# Patient Record
Sex: Male | Born: 1935 | Race: White | Hispanic: No | Marital: Married | Smoking: Never smoker
Health system: Southern US, Community
[De-identification: ages and names within clinical notes are randomized; demographics above are authoritative.]

## PROBLEM LIST (undated history)

## (undated) DIAGNOSIS — I82409 Acute embolism and thrombosis of unspecified deep veins of unspecified lower extremity: Secondary | ICD-10-CM

## (undated) DIAGNOSIS — F0390 Unspecified dementia without behavioral disturbance: Secondary | ICD-10-CM

## (undated) DIAGNOSIS — I2699 Other pulmonary embolism without acute cor pulmonale: Secondary | ICD-10-CM

## (undated) HISTORY — PX: ROTATOR CUFF REPAIR: SHX139

## (undated) HISTORY — PX: SMALL INTESTINE SURGERY: SHX150

## (undated) HISTORY — PX: HERNIA REPAIR: SHX51

## (undated) HISTORY — PX: TRANSURETHRAL RESECTION OF PROSTATE: SHX73

---

## 1999-11-07 ENCOUNTER — Ambulatory Visit (HOSPITAL_COMMUNITY): Admission: RE | Admit: 1999-11-07 | Discharge: 1999-11-07 | Payer: Self-pay | Admitting: Internal Medicine

## 1999-11-07 ENCOUNTER — Encounter: Payer: Self-pay | Admitting: Internal Medicine

## 2000-09-24 ENCOUNTER — Encounter: Admission: RE | Admit: 2000-09-24 | Discharge: 2000-09-24 | Payer: Self-pay | Admitting: Internal Medicine

## 2000-09-24 ENCOUNTER — Encounter: Payer: Self-pay | Admitting: Internal Medicine

## 2003-08-27 ENCOUNTER — Ambulatory Visit (HOSPITAL_COMMUNITY): Admission: RE | Admit: 2003-08-27 | Discharge: 2003-08-27 | Payer: Self-pay | Admitting: *Deleted

## 2003-08-27 ENCOUNTER — Encounter (INDEPENDENT_AMBULATORY_CARE_PROVIDER_SITE_OTHER): Payer: Self-pay | Admitting: Specialist

## 2003-10-29 ENCOUNTER — Ambulatory Visit (HOSPITAL_COMMUNITY): Admission: RE | Admit: 2003-10-29 | Discharge: 2003-10-29 | Payer: Self-pay | Admitting: *Deleted

## 2003-10-29 ENCOUNTER — Encounter (INDEPENDENT_AMBULATORY_CARE_PROVIDER_SITE_OTHER): Payer: Self-pay | Admitting: *Deleted

## 2004-05-05 ENCOUNTER — Ambulatory Visit (HOSPITAL_COMMUNITY): Admission: RE | Admit: 2004-05-05 | Discharge: 2004-05-05 | Payer: Self-pay | Admitting: *Deleted

## 2004-05-05 ENCOUNTER — Encounter (INDEPENDENT_AMBULATORY_CARE_PROVIDER_SITE_OTHER): Payer: Self-pay | Admitting: Specialist

## 2004-06-06 ENCOUNTER — Encounter (INDEPENDENT_AMBULATORY_CARE_PROVIDER_SITE_OTHER): Payer: Self-pay | Admitting: *Deleted

## 2004-06-06 ENCOUNTER — Inpatient Hospital Stay (HOSPITAL_COMMUNITY): Admission: RE | Admit: 2004-06-06 | Discharge: 2004-06-11 | Payer: Self-pay | Admitting: General Surgery

## 2005-01-20 ENCOUNTER — Emergency Department (HOSPITAL_COMMUNITY): Admission: EM | Admit: 2005-01-20 | Discharge: 2005-01-20 | Payer: Self-pay | Admitting: Emergency Medicine

## 2005-06-05 ENCOUNTER — Encounter (INDEPENDENT_AMBULATORY_CARE_PROVIDER_SITE_OTHER): Payer: Self-pay | Admitting: *Deleted

## 2005-06-05 ENCOUNTER — Ambulatory Visit (HOSPITAL_COMMUNITY): Admission: RE | Admit: 2005-06-05 | Discharge: 2005-06-05 | Payer: Self-pay | Admitting: *Deleted

## 2006-02-09 ENCOUNTER — Encounter: Admission: RE | Admit: 2006-02-09 | Discharge: 2006-02-09 | Payer: Self-pay | Admitting: Internal Medicine

## 2006-02-26 ENCOUNTER — Emergency Department (HOSPITAL_COMMUNITY): Admission: EM | Admit: 2006-02-26 | Discharge: 2006-02-26 | Payer: Self-pay | Admitting: Family Medicine

## 2006-07-13 ENCOUNTER — Ambulatory Visit (HOSPITAL_COMMUNITY): Admission: RE | Admit: 2006-07-13 | Discharge: 2006-07-13 | Payer: Self-pay | Admitting: Chiropractic Medicine

## 2006-09-01 ENCOUNTER — Emergency Department (HOSPITAL_COMMUNITY): Admission: EM | Admit: 2006-09-01 | Discharge: 2006-09-01 | Payer: Self-pay | Admitting: Family Medicine

## 2007-05-29 ENCOUNTER — Ambulatory Visit (HOSPITAL_COMMUNITY): Admission: RE | Admit: 2007-05-29 | Discharge: 2007-05-30 | Payer: Self-pay | Admitting: Urology

## 2007-06-04 ENCOUNTER — Encounter: Admission: RE | Admit: 2007-06-04 | Discharge: 2007-06-04 | Payer: Self-pay | Admitting: Internal Medicine

## 2007-07-23 ENCOUNTER — Encounter (INDEPENDENT_AMBULATORY_CARE_PROVIDER_SITE_OTHER): Payer: Self-pay | Admitting: *Deleted

## 2007-07-23 ENCOUNTER — Ambulatory Visit (HOSPITAL_COMMUNITY): Admission: RE | Admit: 2007-07-23 | Discharge: 2007-07-23 | Payer: Self-pay | Admitting: *Deleted

## 2007-09-05 ENCOUNTER — Encounter: Admission: RE | Admit: 2007-09-05 | Discharge: 2007-09-05 | Payer: Self-pay | Admitting: Neurosurgery

## 2008-06-02 ENCOUNTER — Encounter: Admission: RE | Admit: 2008-06-02 | Discharge: 2008-06-02 | Payer: Self-pay | Admitting: Internal Medicine

## 2009-04-16 ENCOUNTER — Encounter: Admission: RE | Admit: 2009-04-16 | Discharge: 2009-04-16 | Payer: Self-pay | Admitting: Internal Medicine

## 2010-04-24 ENCOUNTER — Encounter: Payer: Self-pay | Admitting: Neurosurgery

## 2010-06-01 ENCOUNTER — Other Ambulatory Visit: Payer: Self-pay | Admitting: Internal Medicine

## 2010-06-01 ENCOUNTER — Inpatient Hospital Stay (HOSPITAL_COMMUNITY)
Admission: EM | Admit: 2010-06-01 | Discharge: 2010-06-05 | DRG: 301 | Disposition: A | Discharge status: Home Health | Payer: Medicare Other | Attending: Internal Medicine | Admitting: Internal Medicine

## 2010-06-01 ENCOUNTER — Ambulatory Visit (HOSPITAL_COMMUNITY)
Admission: RE | Admit: 2010-06-01 | Discharge: 2010-06-01 | Disposition: A | Discharge status: Home / Self Care | Payer: Medicare Other | Source: Ambulatory Visit | Attending: Internal Medicine | Admitting: Internal Medicine

## 2010-06-01 DIAGNOSIS — I82409 Acute embolism and thrombosis of unspecified deep veins of unspecified lower extremity: Principal | ICD-10-CM | POA: Diagnosis present

## 2010-06-01 DIAGNOSIS — Z860100 Personal history of colon polyps, unspecified: Secondary | ICD-10-CM

## 2010-06-01 DIAGNOSIS — R9389 Abnormal findings on diagnostic imaging of other specified body structures: Secondary | ICD-10-CM | POA: Insufficient documentation

## 2010-06-01 DIAGNOSIS — M79604 Pain in right leg: Secondary | ICD-10-CM

## 2010-06-01 DIAGNOSIS — D320 Benign neoplasm of cerebral meninges: Secondary | ICD-10-CM | POA: Diagnosis present

## 2010-06-01 DIAGNOSIS — F0390 Unspecified dementia without behavioral disturbance: Secondary | ICD-10-CM | POA: Diagnosis present

## 2010-06-01 DIAGNOSIS — M79609 Pain in unspecified limb: Secondary | ICD-10-CM | POA: Insufficient documentation

## 2010-06-01 DIAGNOSIS — D696 Thrombocytopenia, unspecified: Secondary | ICD-10-CM | POA: Diagnosis present

## 2010-06-01 DIAGNOSIS — N400 Enlarged prostate without lower urinary tract symptoms: Secondary | ICD-10-CM | POA: Diagnosis present

## 2010-06-01 DIAGNOSIS — M4802 Spinal stenosis, cervical region: Secondary | ICD-10-CM | POA: Diagnosis present

## 2010-06-01 DIAGNOSIS — M7989 Other specified soft tissue disorders: Secondary | ICD-10-CM | POA: Insufficient documentation

## 2010-06-01 LAB — BASIC METABOLIC PANEL
CO2: 28 mEq/L (ref 19–32)
Calcium: 9.4 mg/dL (ref 8.4–10.5)
GFR calc non Af Amer: >60 mL/min (ref >60)
Sodium: 138 mEq/L (ref 135–145)

## 2010-06-01 LAB — PROTIME-INR: Prothrombin Time: 14 seconds (ref 11.6–15.2)

## 2010-06-01 LAB — DIFFERENTIAL
Basophils Relative: 0 % (ref 0–1)
Eosinophils Absolute: 0.2 10*3/uL (ref 0.0–0.7)
Eosinophils Relative: 2 % (ref 0–5)
Lymphocytes Relative: 14 % (ref 12–46)
Lymphs Abs: 1.3 10*3/uL (ref 0.7–4.0)
Monocytes Relative: 16 % — ABNORMAL HIGH (ref 3–12)
Neutro Abs: 6.4 10*3/uL (ref 1.7–7.7)

## 2010-06-01 LAB — CBC
MCH: 29.6 pg (ref 26.0–34.0)
Platelets: 107 10*3/uL — ABNORMAL LOW (ref 150–400)
RBC: 5.44 MIL/uL (ref 4.22–5.81)
RDW: 12.8 % (ref 11.5–15.5)

## 2010-06-01 LAB — APTT: aPTT: 30 seconds (ref 24–37)

## 2010-06-02 ENCOUNTER — Inpatient Hospital Stay (HOSPITAL_COMMUNITY): Payer: Medicare Other

## 2010-06-02 LAB — COMPREHENSIVE METABOLIC PANEL
AST: 14 U/L (ref 0–37)
Albumin: 3.3 g/dL — ABNORMAL LOW (ref 3.5–5.2)
BUN: 10 mg/dL (ref 6–23)
Calcium: 8.8 mg/dL (ref 8.4–10.5)
Creatinine, Ser: 0.75 mg/dL (ref 0.4–1.5)
Total Protein: 5.7 g/dL — ABNORMAL LOW (ref 6.0–8.3)

## 2010-06-02 LAB — PROTIME-INR: Prothrombin Time: 14.3 seconds (ref 11.6–15.2)

## 2010-06-02 LAB — CBC
MCH: 29.8 pg (ref 26.0–34.0)
MCHC: 33.4 g/dL (ref 30.0–36.0)
MCV: 89.3 fL (ref 78.0–100.0)
Platelets: 107 10*3/uL — ABNORMAL LOW (ref 150–400)
RBC: 4.86 MIL/uL (ref 4.22–5.81)
RDW: 12.8 % (ref 11.5–15.5)

## 2010-06-02 LAB — HEPARIN LEVEL (UNFRACTIONATED)

## 2010-06-02 LAB — APTT

## 2010-06-02 LAB — PHOSPHORUS: Phosphorus: 2.4 mg/dL (ref 2.3–4.6)

## 2010-06-02 LAB — TECHNOLOGIST SMEAR REVIEW

## 2010-06-02 LAB — MAGNESIUM: Magnesium: 2.1 mg/dL (ref 1.5–2.5)

## 2010-06-03 LAB — CBC
HCT: 42.3 % (ref 39.0–52.0)
MCH: 29.6 pg (ref 26.0–34.0)
MCV: 89.4 fL (ref 78.0–100.0)
Platelets: 120 10*3/uL — ABNORMAL LOW (ref 150–400)
RBC: 4.73 MIL/uL (ref 4.22–5.81)
RDW: 12.8 % (ref 11.5–15.5)
WBC: 7.7 10*3/uL (ref 4.0–10.5)

## 2010-06-03 LAB — PSA: PSA: 0.11 ng/mL (ref <=4.00)

## 2010-06-03 MED ORDER — GADOBENATE DIMEGLUMINE 529 MG/ML IV SOLN
15.0000 mL | Freq: Once | INTRAVENOUS | Status: AC | PRN
  Administered 2010-06-03: 15 mL via INTRAVENOUS

## 2010-06-04 LAB — CBC
HCT: 42.9 % (ref 39.0–52.0)
MCHC: 32.9 g/dL (ref 30.0–36.0)
MCV: 88.8 fL (ref 78.0–100.0)
RDW: 12.7 % (ref 11.5–15.5)

## 2010-06-04 LAB — BASIC METABOLIC PANEL
BUN: 12 mg/dL (ref 6–23)
CO2: 30 mEq/L (ref 19–32)
Chloride: 103 mEq/L (ref 96–112)
Creatinine, Ser: 0.93 mg/dL (ref 0.4–1.5)

## 2010-06-05 LAB — BASIC METABOLIC PANEL WITH GFR
BUN: 8 mg/dL (ref 6–23)
CO2: 32 meq/L (ref 19–32)
Calcium: 8.9 mg/dL (ref 8.4–10.5)
Chloride: 103 meq/L (ref 96–112)
Creatinine, Ser: 0.87 mg/dL (ref 0.4–1.5)
GFR calc non Af Amer: >60 mL/min
Glucose, Bld: 111 mg/dL — ABNORMAL HIGH (ref 70–99)
Potassium: 4.4 meq/L (ref 3.5–5.1)
Sodium: 140 meq/L (ref 135–145)

## 2010-06-05 LAB — CBC
HCT: 43.9 % (ref 39.0–52.0)
Hemoglobin: 14.6 g/dL (ref 13.0–17.0)
MCH: 29.4 pg (ref 26.0–34.0)
MCHC: 33.3 g/dL (ref 30.0–36.0)
MCV: 88.3 fL (ref 78.0–100.0)
Platelets: 144 10*3/uL — ABNORMAL LOW (ref 150–400)
RBC: 4.97 MIL/uL (ref 4.22–5.81)
RDW: 12.5 % (ref 11.5–15.5)
WBC: 6.5 10*3/uL (ref 4.0–10.5)

## 2010-06-05 LAB — PROTIME-INR
INR: 1.51 — ABNORMAL HIGH (ref 0.00–1.49)
Prothrombin Time: 18.4 s — ABNORMAL HIGH (ref 11.6–15.2)

## 2010-06-06 LAB — HEPARIN INDUCED THROMBOCYTOPENIA PNL
UFH Low Dose 0.1 IU/mL: 2 % Release
UFH Low Dose 0.5 IU/mL: 2 % Release
UFH SRA Result: NEGATIVE

## 2010-06-08 ENCOUNTER — Other Ambulatory Visit (HOSPITAL_COMMUNITY): Payer: Self-pay | Admitting: Internal Medicine

## 2010-06-08 DIAGNOSIS — I82403 Acute embolism and thrombosis of unspecified deep veins of lower extremity, bilateral: Secondary | ICD-10-CM

## 2010-06-09 NOTE — H&P (Signed)
NAME:  Luke Montgomery, Luke Montgomery                ACCOUNT NO.:  [id number]  MEDICAL RECORD NO.:  [id number]           PATIENT TYPE:  E  LOCATION:  WLED                         FACILITY:  [facility]  PHYSICIAN:  Cruz Devilla I Teshawn Moan, MD      DATE OF BIRTH:  04-14-1925  DATE OF ADMISSION:  06/01/2010 DATE OF DISCHARGE:                             HISTORY & PHYSICAL   PRIMARY CARE PHYSICIAN:  Soyla Murphy. Renne Crigler, MD  CHIEF COMPLAINT:  The patient is sent from Dr. Carolee Rota office for evaluation of right lower extremity swelling, questionable DVT.  HISTORY OF PRESENT ILLNESS:  This is an 77 year old pleasant gentleman with a history of benign prostate hypertrophy and some cognitive deficiency/dementia.  History mainly obtained from the patient.  The patient did complain of right knee swelling for the last two weeks. Pain worse on the right leg than the left, associated with swelling mainly of the right calf and right knee.  The patient denies any chest pain or shortness of breath associated with that.  The condition is aggravated by movement and by palpation and relieved by rest.  The patient denies any melena or rectal bleeding, or shortness of breath or vomiting.  The patient was seen by Dr. Renne Crigler and was asked to come to the emergency room for evaluation of DVT.  The patient's preliminary report was positive for DVT on his right lower extremity and the patient was also noted to have thrombocytopenia.  Secondary to his old age and thrombocytopenia, hospitalist service was asked to admit.  PAST MEDICAL HISTORY: 1. History of benign prostate hypertrophy and bladder neck     contracture. 2. History of tubulovillous adenoma of the right colon. 3. Mild cognitive deficiency/dementia.  MEDICATIONS: 1. Flomax. 2. Aricept. 3. Risperdal. 4. Lexapro.  ALLERGIES:  No known drug allergies.  PAST SURGICAL HISTORY:  The patient admitted none.  SOCIAL HISTORY:  He admitted he lives with his wife.  His daughter  works at [employer].  Currently, I did not see the daughter and did not find any member at the bedside.  Denies any alcohol abuse.  He quit smoking more than 4 years ago.  REVIEW OF SYSTEMS:  NEUROLOGIC:  The patient denies any headache. Denies any syncope.  Denies any numbness or weakness.  Denies any seizure. CHEST:  Denies any shortness of breath.  Denies any chest pain.  Denies any palpitations.  Denies any orthopnea or paroxysmal nocturnal dyspnea. ABDOMEN:  Denies any nausea or vomiting or abdominal pain.  Had last bowel movement today which was regular, brown.  No change in his bowel habit. UROLOGIC:  Denies any dysuria or hematuria.  Denies any back pain. Denies any severe weight loss. LOWER EXTREMITIES:  Complained of his right knee swelling, which is tender to touch.  Also, complained of bilateral knee pain.  PHYSICAL EXAMINATION:  VITAL SIGNS:  Temperature 98.6, blood pressure 108/64, pulse rate 92, respiratory rate 18, oxygen saturation 98% on 2 L. HEENT AND NECK:  Pupils equal, reactive to light and accommodation. Extraocular muscle movement within normal.  Neck:  Supple.  No lymphadenopathy.  No JVD.  No  lymph node.  No masses. HEART:  S1, S2, with no added sound.  Normal rhythm and rate. LUNG:  Good air entry. ABDOMEN:  Soft, nontender.  Bowel sounds positive.  No organomegaly.  No bruits.  No rebound tenderness. EXTREMITIES:  There is right lower extremity swelling with Hoffman's sign positive.  Peripheral pulses intact bilaterally.  Knee:  There is no knee swelling.  There is limitation of the patient's knee movement especially on flexion. NEUROLOGIC:  The patient is awake, alert, oriented x2.  No neurologic deficit.  PERTINENT LABORATORY AND X-RAY DATA:  Hemoglobin 9.3, hematocrit 16.1, hematocrit 48.5, platelets 107.  BMET:  Sodium 138, potassium 4.5, chloride 102, CO2 28, glucose 101, BUN 12 and creatinine 0.87, calcium 9.4.  Renal Doppler preliminary  report positive for DVT on his right lower extremity.  ASSESSMENT AND PLAN: 1. Right leg deep venous thrombosis.  The patient will be placed on     heparin and Coumadin.  We will get heparin-induced antibodies.  The     patient was noticed to have low platelet at 107.  This will be     monitored very closely.  We will get heparin-induced antibodies, we     will get peripheral smear.  We will get PT and PT/INR and will     start the patient on Coumadin.  We will watch closely for the     patient's platelets while on heparin.  Needs Coumadin teaching, to     be addressed by the patient's family member to complete     understanding. 2. Other medical issues:  The patient needs to resume his home     medications. 3. The patient will be admitted for telemetry for 24-hour monitoring     and if negative could be discontinued. 4. Further recommendations as per the hospital course/progress.     Willma Obando Bosie Helper, MD     HIE/MEDQ  D:  06/01/2010  T:  06/01/2010  Job:  [id number]  Electronically Signed by Ebony Cargo MD on 06/08/2010 05:10:43 PM

## 2010-06-09 NOTE — Discharge Summary (Signed)
NAME:  Luke Montgomery, Luke Montgomery                ACCOUNT NO.:  [id number]  MEDICAL RECORD NO.:  [id number]           PATIENT TYPE:  I  LOCATION:  1529                         FACILITY:  [facility]  PHYSICIAN:  Hillery Aldo, M.D.   DATE OF BIRTH:  11/25/1925  DATE OF ADMISSION:  06/01/2010 DATE OF DISCHARGE:  06/05/2010                              DISCHARGE SUMMARY   PRIMARY CARE PHYSICIAN:  Soyla Murphy. Pharr, M.D.  DISCHARGE DIAGNOSES: 1. Bilateral lower extremity deep venous thrombosis. 2. History of thrombocytopenia. 3. History of dysplastic colon polyp. 4. Dementia. 5. Benign prostatic hypertrophy. 6. History of meningioma. 7. Cervical spinal stenosis.  DISCHARGE MEDICATIONS: 1. Lovenox 70 mg subcutaneously b.i.d. until instructed to stop by Dr.     Renne Crigler. 2. Namenda 10 mg p.o. b.i.d. 3. Warfarin 5 mg p.o. daily or as directed by Dr. Carolee Rota office. 4. Aricept 10 mg p.o. daily. 5. Citalopram 20 mg p.o. daily. 6. Fish oil OTC 1 capsule p.o. daily. 7. Flomax 0.4 mg p.o. nightly. 8. Glucosamine OTC 1 tablet p.o. daily. 9. Multivitamin 1 tablet p.o. daily. 10.Risperdal one-half tablet p.o. nightly. 11.CoQ10 100 mg p.o. daily. 12.Supple vitamin drink OTC 1 can p.o. daily.  Note:  The patient was instructed to discontinue herbal supplements until these have been fully cleared by his primary care physician due to potential for interactions with Coumadin.  CONSULTATIONS:  None.  BRIEF ADMISSION HPI:  The patient is an 77 year old male who was sent to the hospital for evaluation of possible DVT by his primary care physician.  Upon initial evaluation in the emergency department, Doppler studies did confirm bilateral DVTs and he was referred to the hospitalist service for admission.  For full details, please see the dictated report done by Dr. Eda Paschal.  PROCEDURES AND DIAGNOSTIC STUDIES: 1. Lower-extremity Doppler done on June 01, 2010, showed acute DVT     involving the right lower  extremity and the left lower extremity. 2. MRI of the cervical spine on June 03, 2010, showed unchanged     meningioma at the foramen magnum with stable mass effect on the     cervicomedullary junction.  No new lesions.  Interval decreased     size of central disk protrusion at C3-C4 with improved mass effect     on the cord.  Moderate central and biforaminal stenosis at that     level.  Stable multilevel spondylosis.  DISCHARGE LABORATORY VALUES:  PT was 18.4, INR 1.51.  Sodium was 140, potassium 4.4, chloride 103, bicarb 32, BUN 8, creatinine 0.87, glucose 111, calcium 8.9.  White blood cell count was 6.5, hemoglobin 14.6, hematocrit 43.9, platelets 144.  PSA was 0.11.  CEA was 1.3.  HOSPITAL COURSE BY PROBLEMS: 1. Unprovoked deep venous thrombosis:  The patient had no history of     recent travel or surgeries to explain bilateral deep venous     thrombosis.  Because of this, concerns for underlying malignancy     were addressed by checking a CEA, a PSA, and an MRI of his cervical     spine to evaluate the stability of prior history of meningioma.  The patient's CEA and PSA levels were not elevated.  The meningioma     was found to be stable.  At this point, the trigger for his     bilateral DVTs is not entirely clear, but consideration can be made     for a further malignancy investigation as an outpatient.  The     patient was initially put on IV heparin and Coumadin.  This was     transitioned to subcutaneous Lovenox with the plan to discharge him     home as soon as family could be adequately trained in delivering     Lovenox.  At this point, family has been trained and the plan is to     discharge him home.  We will have home health nursing services come     and draw daily PT/INRs to fax the results to Dr. Carolee Rota office, so     that further dosage adjustments can be made to his Coumadin dose.     The Lovenox can be discontinued when his INR is therapeutic for 48      hours. 2. Thrombocytopenia:  Dr. Carolee Rota office did fax over his baseline lab     values and it does appear that he does have a mild chronic     thrombocytopenia.  His platelet count is stable and has not been     affected by blood thinning medications. 3. History of dysplastic colon polyps:  CEA was normal. 4. Dementia:  The patient was continued on Aricept.  Namenda was added     at the request of the patient's daughter who states that this was     discussed with her in the past by his treating physicians. 5. Benign prostatic hypertrophy:  The patient was maintained on     Flomax. 6. History of meningioma:  Again, followup MRI showed stability. 7. Cervical spinal stenosis:  Asymptomatic at present.  DISPOSITION:  The patient is medically stable and discharged home.  Time spent in coordinating care for discharge and discharge instructions including face-to-face time equals 35 minutes.     Hillery Aldo, M.D.     CR/MEDQ  D:  06/05/2010  T:  06/06/2010  Job:  [id number]  cc:   Soyla Murphy. Renne Crigler, M.D. Fax: [phone]  Electronically Signed by Hillery Aldo M.D. on 06/07/2010 07:43:20 PM

## 2010-06-10 ENCOUNTER — Ambulatory Visit (HOSPITAL_COMMUNITY)
Admission: RE | Admit: 2010-06-10 | Discharge: 2010-06-10 | Disposition: A | Discharge status: Home / Self Care | Payer: Medicare Other | Source: Ambulatory Visit | Attending: Internal Medicine | Admitting: Internal Medicine

## 2010-06-10 DIAGNOSIS — D6859 Other primary thrombophilia: Secondary | ICD-10-CM | POA: Insufficient documentation

## 2010-06-10 DIAGNOSIS — I2699 Other pulmonary embolism without acute cor pulmonale: Secondary | ICD-10-CM | POA: Insufficient documentation

## 2010-06-10 DIAGNOSIS — I82409 Acute embolism and thrombosis of unspecified deep veins of unspecified lower extremity: Secondary | ICD-10-CM | POA: Insufficient documentation

## 2010-06-10 DIAGNOSIS — I82403 Acute embolism and thrombosis of unspecified deep veins of lower extremity, bilateral: Secondary | ICD-10-CM

## 2010-06-10 DIAGNOSIS — K5730 Diverticulosis of large intestine without perforation or abscess without bleeding: Secondary | ICD-10-CM | POA: Insufficient documentation

## 2010-06-10 DIAGNOSIS — N281 Cyst of kidney, acquired: Secondary | ICD-10-CM | POA: Insufficient documentation

## 2010-06-10 MED ORDER — IOHEXOL 300 MG/ML  SOLN
80.0000 mL | Freq: Once | INTRAMUSCULAR | Status: AC | PRN
  Administered 2010-06-10: 80 mL via INTRAVENOUS

## 2010-06-13 MED ORDER — IOHEXOL 300 MG/ML  SOLN
80.0000 mL | Freq: Once | INTRAMUSCULAR | Status: AC | PRN
  Administered 2010-06-13: 80 mL via INTRAVENOUS

## 2010-08-16 NOTE — Op Note (Signed)
NAME:  Luke Montgomery, Luke Montgomery                ACCOUNT NO.:  [id number]   MEDICAL RECORD NO.:  [id number]          PATIENT TYPE:  AMB   LOCATION:  DAY                          FACILITY:  [facility]   PHYSICIAN:  Valetta Fuller, M.D.  DATE OF BIRTH:  [birth year]   DATE OF PROCEDURE:  05/29/2007  DATE OF DISCHARGE:                               OPERATIVE REPORT   PREOPERATIVE DIAGNOSIS:  Bladder neck contracture.   POSTOPERATIVE DIAGNOSIS:  Bladder neck contracture.   PROCEDURE PERFORMED:  Cystoscopy with holmium laser incision of bladder  neck contracture.   SURGEON:  Valetta Fuller, MD.   ANESTHESIA:  General.   INDICATIONS:  Mr. Mansfield is a 77 year old male.  He has had some pretty  longstanding voiding symptoms.  __________ symptoms were approximately  20 with a high bothersome index.  The patient had previously had a TURP.  Reevaluation showed some degree of bladder neck contracture, although it  was not extremely tight.  Last evaluation revealed a 200 ml residual  urine.  The patient was felt to have a probable recurrent obstruction  due to the bladder neck contracture as well as some degree of a  hypotonic bladder.  We had talked about the possibility of incising this  contracture to see if that would improve his voiding.  The patient  elected to proceed with that surgery.  He appeared to understand the  advantages and disadvantages of this type of surgery and understood that  this may not result in any marked improvement in his voiding.   TECHNIQUE AND FINDINGS:  The patient was brought to the operating room  where he had the successful induction of general anesthesia.  He was  placed in the lithotomy position and prepped and draped in the usual  manner.  Cystoscopy revealed a relatively well-resected prostatic fossa  with a bladder neck contracture that again was moderate in nature.  We  initiated his procedure by performing a holmium laser incision of the  bladder neck contracture at  the 6 o'clock position.  Once it was opened  up to allow for the scope to get through there, we could identify both  ureteral orifices, and indigo carmine was given.  After administration  of indigo carmine, he had some temporary bradycardia, which responded  nicely to Atropine.  The holmium laser was used to continue the incision  through the bladder neck, between the ureteral orifices, back to the  verumontanum.  This resulted in a nice opening of the bladder neck  contracture with no significant bleeding.  On completion of the  procedure, a Foley catheter was inserted and the urine drained clear  urine.  The patient will be kept 23 hours for observation.  He was  brought to the recovery room in stable condition.           ______________________________  Valetta Fuller, M.D.  Electronically Signed     DSG/MEDQ  D:  05/29/2007  T:  05/29/2007  Job:  [id number]

## 2010-08-16 NOTE — Op Note (Signed)
NAME:  Luke Montgomery, SPRINKLE                ACCOUNT NO.:  [id number]   MEDICAL RECORD NO.:  [id number]          PATIENT TYPE:  AMB   LOCATION:  ENDO                         FACILITY:  [facility]   PHYSICIAN:  Georgiana Spinner, M.D.    DATE OF BIRTH:  05-10-1925   DATE OF PROCEDURE:  07/23/2007  DATE OF DISCHARGE:                               OPERATIVE REPORT   PROCEDURE:  Colonoscopy.   INDICATIONS:  Colon polyps.   ANESTHESIA:  Fentanyl 50 mcg, Versed 3 mg.   DESCRIPTION OF PROCEDURE:  With the patient mildly sedated in the left  lateral decubitus position a rectal examination was performed which was  unremarkable on exam.  Subsequently, the Pentax videoscopic colonoscope  was inserted in the rectum and passed under direct vision to the neo-  cecum which was identified by the ileocecal valve that had been made  along with base of the neo-cecum, both of which were photographed.  The  prep was slightly suboptimal in that there were areas of tenacious  yellow thick material that had to be suctioned as best we could.  The  patient had not taken the prep as directed.  But from this point, the  colonoscope was slowly withdrawn, taking circumferential views of the  colonic mucosa, stopping first in the distal transverse colon where a  polyp was seen, photographed, and removed using snare cautery technique,  setting of 20/150 blended current.  There was a small amount of polypoid  tissue remaining after suctioning the polyp through the endoscope and  this was removed by using just the biopsy forceps.  It may have already  been removed but was still in situ.  The tissue was retained for  pathology.  The endoscope was then withdrawn all the way to the rectum,  taking circumferential views of the remaining colonic mucosa, stopping  at 20 cm from the anal verge, at which point a second polyp was seen,  photographed, and it too was removed, this time using just hot biopsy  forceps technique with the same  setting of 20/150 blended current.  In  the rectum, the endoscope was placed in retroflexed view to view the  anal canal from above.  The endoscope was straightened and withdrawn.  The patient's vital signs and pulse oximeter remained stable.  The  patient tolerated the procedure well without apparent complications.   FINDINGS:  Two polyps, as described above.  Await biopsy report.  The  patient will call me for results and follow-up with me as needed as an  outpatient.           ______________________________  Georgiana Spinner, M.D.     GMO/MEDQ  D:  07/23/2007  T:  07/23/2007  Job:  [id number]

## 2010-08-19 NOTE — Op Note (Signed)
NAME:  Luke Montgomery, COOTS NO.:  [id number]   MEDICAL RECORD NO.:  [id number]          PATIENT TYPE:  AMB   LOCATION:  ENDO                         FACILITY:  [facility]   PHYSICIAN:  Georgiana Spinner, M.D.    DATE OF BIRTH:  09/15/1925   DATE OF PROCEDURE:  05/05/2004  DATE OF DISCHARGE:                                 OPERATIVE REPORT   PROCEDURE PERFORMED:  Colonoscopy with biopsy.   ENDOSCOPIST:  Georgiana Spinner, M.D.   INDICATIONS FOR PROCEDURE:  Colon polyps, hemoccult positivity.   ANESTHESIA:  Demerol 50 mg, Versed 5 mg.   DESCRIPTION OF PROCEDURE:  With the patient mildly sedated in the left  lateral decubitus position, a rectal examination was performed.  The  prostate was somewhat prominent.  Subsequently, the Olympus videoscopic  colonoscope was inserted in the rectum and passed under direct vision to the  cecum, identified by the ileocecal valve and appendiceal orifice, the latter  of which were photographed.  A cecal polyp was noted.  It was fairly large.  It was certainly over a centimeter in size and this was photographed and  biopsied only.  From this point the colonoscope was slowly withdrawn taking  circumferential views of the colonic mucosa stopping at 20 cm from the anal  verge at which point a polyp was seen, photographed and removed using hot  biopsy forceps technique, setting of 20/20 blended current.  The endoscope  was placed on retroflexion in the rectum and then straightened and  withdrawn.  The patient's vital signs and pulse oximeter remained stable.  The patient tolerated the procedure well without apparent complications.   FINDINGS:  Polyp at 20 cm from the anal verge.  Internal hemorrhoids.  Large  polyp of cecum.   ASSESSMENT/PLAN:  The patient had a large polyp removed by me a year ago  which had high grade dysplasia and now has recurrence of a fairly large  polyp in the same area.  Rather than removing this endoscopically, I think  that the patient is going to need to have surgery and have a right  hemicolectomy regardless of biopsy report and I will therefore refer him to  surgery for further evaluation.  Of note, his prostate was hard and I don't  know if he has seen a urologist.  If not, we will refer him.      GMO/MEDQ  D:  05/05/2004  T:  05/05/2004  Job:  [id number]

## 2010-08-19 NOTE — Op Note (Signed)
NAME:  Luke Montgomery, Luke Montgomery                ACCOUNT NO.:  [id number]   MEDICAL RECORD NO.:  [id number]          PATIENT TYPE:  AMB   LOCATION:  ENDO                         FACILITY:  [facility]   PHYSICIAN:  Georgiana Spinner, M.D.    DATE OF BIRTH:  [birth year]   DATE OF PROCEDURE:  06/05/2005  DATE OF DISCHARGE:                                 OPERATIVE REPORT   PROCEDURE:  Colonoscopy.   INDICATIONS:  Colon polyps.   ANESTHESIA:  Demerol 50 mg, Versed 5 mg.   PROCEDURE:  With the patient mildly sedated in the left lateral decubitus  position, a rectal exam was performed -- which was unremarkable.  Subsequently, the Olympus videoscopic colonoscope was inserted into the  rectum and passed under direct vision to the cecum,  identified by ileocecal  valve and appendiceal orifice --  both of which were photographed. Of note,  there were surgical changes adjacent to the ileocecal valve, also  photographed. From this point, the colonoscope was slowly withdrawn, taking  circumferential views of colonic mucosa and stopping in the rectum (after  removing 2 polyps of 30 cm, both by hot biopsy forceps technique). The  endoscope in the rectum was placed in retroflexion to view the anal canal  from above.  The endoscope was straightened and withdrawn. The patient's  vital signs, pulse oximetry remained stable. The patient tolerated procedure  well without apparent complication.   FINDINGS:  Two small polyps at 30 cm from the anal verge.  Surgical changes  seen in the cecum, otherwise an unremarkable colonoscopic examination to the  cecum.   PLAN:  Await biopsy report. The patient will call me for results and follow-  up with me as an outpatient.           ______________________________  Georgiana Spinner, M.D.     GMO/MEDQ  D:  06/05/2005  T:  06/06/2005  Job:  [id number]

## 2010-08-19 NOTE — Op Note (Signed)
NAME:  Luke Montgomery, Luke Montgomery                          ACCOUNT NO.:  [id number]   MEDICAL RECORD NO.:  [id number]                   PATIENT TYPE:  AMB   LOCATION:  ENDO                                 FACILITY:  [facility]   PHYSICIAN:  Georgiana Spinner, M.D.                 DATE OF BIRTH:  Aug 05, 1925   DATE OF PROCEDURE:  08/27/2003  DATE OF DISCHARGE:                                 OPERATIVE REPORT   PROCEDURE:  Upper endoscopy.   INDICATIONS FOR PROCEDURE:  GERD.   ANESTHESIA:  Demerol 20, Versed 4 mg.   PROCEDURE:  With the patient mildly sedated in the left lateral decubitus  position, the Olympus videoscopic endoscope was inserted in the mouth and  passed under direct vision through the esophagus which appeared normal until  I reached the distal esophagus where Barrett's was seen, photographed, and  biopsied.  We entered into the stomach.  The fundus, body, antrum, duodenal  bulb, and second portion of the duodenum were visualized.  From this point,  the endoscope was slowly withdrawn taking circumferential views of the  entire duodenal mucosa until the endoscope was pulled back into the stomach  and placed in retroflexion to view the stomach from below.  The endoscope  was then straightened and withdrawn taking circumferential views of the  remaining gastric and esophageal mucosa.  The patient's vital signs and  pulse oximeter remained stable.  The patient tolerated the procedure well  without apparent complications.   FINDINGS:  Barrett's esophagus, biopsied.   PLAN:  Await biopsy report, the patient will call me for results and follow  up with me as an outpatient.  Proceed to colonoscopy as planned.                                               Georgiana Spinner, M.D.    GMO/MEDQ  D:  08/27/2003  T:  08/27/2003  Job:  [id number]

## 2010-08-19 NOTE — Discharge Summary (Signed)
[name], Luke Montgomery                ACCOUNT NO.:  [id number]   MEDICAL RECORD NO.:  [id number]          PATIENT TYPE:  INP   LOCATION:  5735                         FACILITY:  [facility]   PHYSICIAN:  Cherylynn Ridges, M.D.    DATE OF BIRTH:  04-12-1925   DATE OF ADMISSION:  06/06/2004  DATE OF DISCHARGE:  06/11/2004                                 DISCHARGE SUMMARY   DISCHARGE DIAGNOSIS:  Tubulovillous adenoma of the right colon or cecum with  a high-grade granular dysplasia, no invasive carcinoma seen.   PRINCIPAL PROCEDURE:  Partial cectomy.   SURGEON:  Dr. Lindie Spruce.   DISCHARGE MEDICATIONS:  Vicodin as needed for pain.   DISCHARGE DIET:  No meats for two weeks.   FOLLOWUP:  He was to follow up to see me in my office on June 16, 2004.   BRIEF SUMMARY OF HOSPITAL COURSE:  The patient was came in after bowel prep  on the same day of surgery, which was June 06, 2004.  At that time he  underwent a partial cecal resection for a villous adenoma.  This was done  with primary hands on closure, Dr. Lurene Shadow was the assistance.  The patient  did well postoperative day #1 with no significant fevers, went up to 100.2  on postoperative day #2, his wound was looking well, he had some bowel  sounds, but no bowel movement, no flatus.  Postoperative day #3 he improved  much more significantly, he was discharged home on postoperative day #4  tolerating a diet well.  His wound looked well and he was to follow-up to  see me in on June 16, 2004.      JOW/MEDQ  D:  08/03/2004  T:  08/03/2004  Job:  [id number]

## 2010-08-19 NOTE — Op Note (Signed)
Luke Montgomery, Luke Montgomery                ACCOUNT NO.:  [id number]   MEDICAL RECORD NO.:  [id number]          PATIENT TYPE:  INP   LOCATION:  2859                         FACILITY:  [facility]   PHYSICIAN:  Cherylynn Ridges III, M.D.DATE OF BIRTH:  1926/03/20   DATE OF PROCEDURE:  06/06/2004  DATE OF DISCHARGE:                                 OPERATIVE REPORT   PREOPERATIVE DIAGNOSES:  Dysplastic sessile polyp of the cecum.   POSTOPERATIVE DIAGNOSES:  Dysplastic sessile polyp of the cecum.   OPERATION PERFORMED:  Partial cecal resection.   SURGEON:  Marta Lamas. Lindie Spruce, M.D.   ASSISTANT:  Leonie Man, M.D.   ANESTHESIA:  General endotracheal.   ESTIMATED BLOOD LOSS:  Less than 50 mL.   COMPLICATIONS:  None.   CONDITION:  Stable.   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   FINDINGS:  The patient had a sessile polyp of the cecum just around away  from the ileocecal valve.  There was no evidence of metastasis throughout  the peritoneal cavity including the liver, the spleen, the gallbladder, etc.   DESCRIPTION OF PROCEDURE:  The patient was taken to the operating room and  placed on the table in the supine position.  After an adequate endotracheal  anesthetic was administered, the patient was prepped and draped in the usual  sterile manner exposing the right lower quadrant.  A transverse incision  about 8 to 9 cm long was made about 2 cm above the anterior superior iliac  spine and went to the midline.  We took it down to the anterior fascia of  the rectus sheath and we incised the anterior sheath.  We retracted the  muscle medially and exposed the inferior epigastric vessels posteriorly.  These were taken with hemostat clamps and 2-0 silk ties.  We subsequently  incised the posterior sheath with electrocautery.  The posterior sheath  was  incised and then opened medially and laterally.   The patient was placed in a little bit of Trendelenburg position and the  left side was tilted down.  We were able to mobilize the cecum into the  wound.  This was done and then two stay sutures were made across the tinea  at the base of the cecum and we incised in between that using  electrocautery.  Using [employer] retractors and suction, we were able to  identify the sessile polyp away from the ileocecal valve.  We resected this  along with about a 2 mm margin around it using electrocautery.   Hemostasis was obtained with electrocautery and we subsequently closed the  cecotomy using a two-layer closure.  There was a mucosal layer of running 3-  0 Vicryl and then the Lembert stitches on top of that with 3-0 silk pop  offs.  We made sure that multiple sutures were used.  Stay sutures were  removed.  There was no leakage from this area and the ileocecal valve  appeared  to be patent.   We ran the small bowel from the terminal ileum up to close to the ligament  of Treitz but saw no other evidence of disease.  We were able to get a hand  in and feel the liver and it was normal.  Once this was done, we irrigated  the right lower quadrant with saline and then we closed the  posterior  peritoneum and fascia using running 3-0 Vicryl suture. The anterior fascia  and the sheath were closed using a running #1 PDS suture.  We irrigated at  all levels with saline.  Once the external oblique fascia and rectus sheath  were reapproximated, we closed the skin using stainless steel staples.  All  sponge, needle and instrument counts were correct.      JOW/MEDQ  D:  06/06/2004  T:  06/06/2004  Job:  [id number]

## 2010-08-19 NOTE — Op Note (Signed)
NAME:  Luke Montgomery, Luke Montgomery                          ACCOUNT NO.:  [id number]   MEDICAL RECORD NO.:  [id number]                   PATIENT TYPE:  AMB   LOCATION:  ENDO                                 FACILITY:  [facility]   PHYSICIAN:  Georgiana Spinner, M.D.                 DATE OF BIRTH:  1926-01-18   DATE OF PROCEDURE:  08/27/2003  DATE OF DISCHARGE:                                 OPERATIVE REPORT   PROCEDURE:  Colonoscopy.   INDICATIONS FOR PROCEDURE:  Colon cancer screening.   ANESTHESIA:  None further given.   PROCEDURE:  With the patient mildly sedated in the left lateral decubitus  position, the Olympus videoscopic colonoscope was inserted in the rectum and  passed under direct vision to the cecum identified by the base of the cecum  and ileocecal valve.  In the cecum was a very large polyp that was  photographed and biopsied.  From this point, the colonoscope was slowly  withdrawn taking circumferential views of the colonic mucosa stopping in the  rectum where a second polyp was seen, much smaller.  This, too, was  photographed and removed using hot biopsy forceps technique, setting 20/200.  The endoscope was then placed in retroflexion to view the anal canal from  above.  Internal hemorrhoids were seen and photographed.  The endoscope was  straightened and withdrawn.  The patient's vital signs and pulse oximeter  remained stable.  The patient tolerated the procedure well without apparent  complications.   FINDINGS:  A large polyp of the cecum, biopsied.  Polyp of the rectum,  removed.   PLAN:  Await biopsy report, the patient will call me for results and follow  up with me as an outpatient.                                               Georgiana Spinner, M.D.    GMO/MEDQ  D:  08/27/2003  T:  08/27/2003  Job:  [id number]

## 2010-08-19 NOTE — Op Note (Signed)
NAME:  Luke Montgomery, Luke Montgomery                          ACCOUNT NO.:  [id number]   MEDICAL RECORD NO.:  [id number]                   PATIENT TYPE:  AMB   LOCATION:  ENDO                                 FACILITY:  [facility]   PHYSICIAN:  Georgiana Spinner, M.D.                 DATE OF BIRTH:  25-Jun-1925   DATE OF PROCEDURE:  10/29/2003  DATE OF DISCHARGE:                                 OPERATIVE REPORT   PROCEDURE:  Colonoscopy with polypectomy and eradication of tumor.   ANESTHESIA:  Demerol 90, Versed 9 mg.   DESCRIPTION OF PROCEDURE:  With the patient mildly sedated in the left  lateral decubitus position, the Olympus videoscopic colonoscope was inserted  in the rectum, passed under direct vision to the cecum, identified by  ileocecal valve and base of cecum.  A large polyp was seen in the cecum.  It  was photographed and subsequently with the patient on his back which gave [place]  best access to the polyp, we first ensnared the polyp and using regular  cautery, piecemeal removed a number of large pieces of polyp tissue.  Once  this was accomplished, we started removing the remainder of the polyp tissue  using the [facility].  Once this had been done to my  satisfaction, we took a photograph of this remnant and withdrew the  colonoscope. The patient's vital signs and pulse oximetry remained stable.  The patient tolerated the procedure well without apparent complications.   FINDINGS:  Large polyp of cecum, removed by polypectomy and Yerby Argon  photocoagulation.   PLAN:  Await clinical response and results of biopsy. The patient will call  me for results and follow up with me as an outpatient.                                               Georgiana Spinner, M.D.    GMO/MEDQ  D:  10/29/2003  T:  10/29/2003  Job:  [id number]

## 2010-12-23 LAB — BASIC METABOLIC PANEL
BUN: 7
Calcium: 9
Chloride: 106
Creatinine, Ser: 0.89
GFR calc Af Amer: >60
GFR calc non Af Amer: >60

## 2010-12-23 LAB — HEMOGLOBIN AND HEMATOCRIT, BLOOD
HCT: 46.8
Hemoglobin: 16

## 2011-02-14 ENCOUNTER — Other Ambulatory Visit: Payer: Self-pay | Admitting: Internal Medicine

## 2011-10-31 ENCOUNTER — Emergency Department (INDEPENDENT_AMBULATORY_CARE_PROVIDER_SITE_OTHER): Payer: Medicare Other

## 2011-10-31 ENCOUNTER — Encounter (HOSPITAL_COMMUNITY): Payer: Self-pay | Admitting: Emergency Medicine

## 2011-10-31 ENCOUNTER — Emergency Department (HOSPITAL_COMMUNITY)
Admission: EM | Admit: 2011-10-31 | Discharge: 2011-10-31 | Disposition: A | Discharge status: Home / Self Care | Payer: Medicare Other | Source: Home / Self Care | Attending: Emergency Medicine | Admitting: Emergency Medicine

## 2011-10-31 DIAGNOSIS — IMO0002 Reserved for concepts with insufficient information to code with codable children: Secondary | ICD-10-CM

## 2011-10-31 DIAGNOSIS — S73102A Unspecified sprain of left hip, initial encounter: Secondary | ICD-10-CM

## 2011-10-31 HISTORY — DX: Other pulmonary embolism without acute cor pulmonale: I26.99

## 2011-10-31 HISTORY — DX: Unspecified dementia, unspecified severity, without behavioral disturbance, psychotic disturbance, mood disturbance, and anxiety: F03.90

## 2011-10-31 HISTORY — DX: Acute embolism and thrombosis of unspecified deep veins of unspecified lower extremity: I82.409

## 2011-10-31 MED ORDER — DICLOFENAC SODIUM 1 % TD GEL: 1.0000 "application " | Freq: Four times a day (QID) | TRANSDERMAL | Status: DC

## 2011-10-31 MED ORDER — CYCLOBENZAPRINE HCL 10 MG PO TABS: 10.0000 mg | ORAL_TABLET | Freq: Three times a day (TID) | ORAL | Status: AC | PRN

## 2011-10-31 NOTE — ED Notes (Addendum)
Left hip pain an swelling, complains of hearing pop when standing this afternoon.  Stood from a lying position. family/patient deny any fall.   Patient is limping with ambulation .  No visible bruising

## 2011-10-31 NOTE — ED Provider Notes (Signed)
History     CSN: [id number]  Arrival date & time 10/31/11  1843   First MD Initiated Contact with Patient 10/31/11 1920      Chief Complaint  Patient presents with  . Hip Pain    (Consider location/radiation/quality/duration/timing/severity/associated sxs/prior treatment) HPI Comments: Patient states that he got up to a standing position, and felt a "pop" in his anterior left hip. Now reports pain, swelling in the anterior/lateral part of his head, which is worse with hip flexion. No sense of dislocation. No gross deformity, bruising, distal paresthesias. Patient denies fall. He is able to walk on it, but states that it hurts. 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 Family states that his INR is at therapeutic levels, and he has not had any recent medication adjustments. Last INR 2 weeks ago.  ROS as noted in HPI. All other ROS negative.   Patient is a 78 y.o. male presenting with hip pain. The history is provided by the patient and a relative. No language interpreter was used.  Hip Pain This is a new problem. The current episode started 3 to 5 hours ago. The problem occurs constantly. The problem has not changed since onset.Pertinent negatives include no abdominal pain. The symptoms are aggravated by walking. Nothing relieves the symptoms. He has tried nothing for the symptoms. The treatment provided no relief.    Past Medical History  Diagnosis Date  . Dementia   . DVT (deep venous thrombosis)   . Pulmonary embolism     Past Surgical History  Procedure Date  . Hernia repair   . Small intestine surgery     secondary to removal of recurrent polyp    History reviewed. No pertinent family history.  History  Substance Use Topics  . Smoking status: Never Smoker   . Smokeless tobacco: Not on file  . Alcohol Use: No      Review of Systems  Gastrointestinal: Negative for abdominal pain.    Allergies  Dilaudid and Penicillins  Home Medications   Current Outpatient  Rx  Name Route Sig Dispense Refill  . ARICEPT PO Oral Take by mouth.    Marland Kitchen LEXAPRO PO Oral Take by mouth.    Marland Kitchen RISPERDAL PO Oral Take by mouth.    . COUMADIN PO Oral Take by mouth.    . CYCLOBENZAPRINE HCL 10 MG PO TABS Oral Take 1 tablet (10 mg total) by mouth 3 (three) times daily as needed for muscle spasms. 20 tablet 0  . DICLOFENAC SODIUM 1 % TD GEL Topical Apply 1 application topically 4 (four) times daily. 100 g 0    BP 105/53  Pulse 55  Temp 97.8 F (36.6 C) (Oral)  Resp 18  SpO2 100% Filed Vitals:   10/31/11 2138  BP: 105/53  Pulse: 55  Temp: 97.8 F (36.6 C)  TempSrc: Oral  Resp: 18  SpO2: 100%     Physical Exam  Nursing note and vitals reviewed. Constitutional: He is oriented to person, place, and time. He appears well-developed and well-nourished.  HENT:  Head: Normocephalic and atraumatic.  Eyes: Conjunctivae and EOM are normal.  Neck: Normal range of motion.  Cardiovascular: Normal rate.   Pulmonary/Chest: Effort normal. No respiratory distress.  Abdominal: He exhibits no distension.  Musculoskeletal: Normal range of motion.       Legs:      No signs of trauma L hip. No bruising, erythema, rash. mild bulging when actively flexes hip- see drawing. Mild muscular tenderness in this  area. No tenderness gluteal muscles,  IT band, quadriceps. No tenderness over pelvis, greater trochanter, down femur. No pain with passive abduction/adduction of leg. No pain with int/ext rotation hip. No tenderness at sciatic notch. Roll test for muscle spasm negative. Flexion/extension knee WNL. Knee joint NT, stable. Motor strenght flexion/ext hip 5/5. Calves symmetric. Sensation to LT intact. DP 2+. Patient able to bear weight but with antalgic gait.  Neurological: He is alert and oriented to person, place, and time. Coordination normal.  Skin: Skin is warm and dry.  Psychiatric: He has a normal mood and affect. His behavior is normal. Judgment and thought content normal.    ED  Course  Procedures (including critical care time)  Labs Reviewed - No data to display Dg Hip Complete Left  10/31/2011  *RADIOLOGY REPORT*  Clinical Data: Left-sided head pain.  LEFT HIP - COMPLETE 2+ VIEW  Comparison: No priors.  Findings: A single AP view of the pelvis and AP and lateral views of the left hip demonstrate no acute displaced fractures, subluxations or dislocations.  There is joint space narrowing, subchondral cyst formation, subchondral sclerosis and extensive osteophyte formation in the hip joints bilaterally, compatible with osteoarthritis.  IMPRESSION: 1.  No acute radiographic abnormality of the bony pelvis or the hips bilaterally. 2.  Moderate - severe osteoarthritis of the hip joints bilaterally.  Original Report Authenticated By: Florencia Reasons, M.D.     1. Sprain of left hip     MDM  Imaging reviewed by myself. OA. No fx, dislocation. Full report per radiologist.   H&P most c/w hip sprain. He may have a hairline fracture, next imaging study would be CT or MRI. No evidence of fx, dislocation, ecchymosis on exam.  Discussed imaging findings with patient and caregiver. Discussed MDM and plan. Home with topical diclofenac, Tylenol, Flexeril. Discussed that the Flexeril will significantly increases his risk for fall, so cautioned him to not use this unless  absolutely necessary.  will refer him to Dr. Luiz Blare, ortho on call. Discussed symptoms that should prompt his return to the department. They agree with plan.  Luiz Blare, MD 10/31/11 2207

## 2013-04-30 ENCOUNTER — Emergency Department (HOSPITAL_COMMUNITY)
Admission: EM | Admit: 2013-04-30 | Discharge: 2013-04-30 | Disposition: A | Discharge status: Home / Self Care | Payer: Medicare Other | Source: Home / Self Care | Attending: Family Medicine | Admitting: Family Medicine

## 2013-04-30 ENCOUNTER — Emergency Department (INDEPENDENT_AMBULATORY_CARE_PROVIDER_SITE_OTHER): Payer: Medicare Other

## 2013-04-30 ENCOUNTER — Encounter (HOSPITAL_COMMUNITY): Payer: Self-pay | Admitting: Emergency Medicine

## 2013-04-30 DIAGNOSIS — S40019A Contusion of unspecified shoulder, initial encounter: Secondary | ICD-10-CM

## 2013-04-30 MED ORDER — TRAMADOL HCL 50 MG PO TABS: 50.0000 mg | ORAL_TABLET | Freq: Four times a day (QID) | ORAL | Status: DC | PRN

## 2013-04-30 NOTE — Discharge Instructions (Signed)
Thank you for coming in today. Use Tylenol every 6 hours for the next several days for pain control. Use tramadol for severe pain. Use this medication sparingly as it may cause confusion.  Followup with Dr. Alfonso Ramus at [place] if not getting better  Contusion A contusion is a deep bruise. Contusions are the result of an injury that caused bleeding under the skin. The contusion may turn blue, purple, or yellow. Minor injuries will give you a painless contusion, but more severe contusions may stay painful and swollen for a few weeks.  CAUSES  A contusion is usually caused by a blow, trauma, or direct force to an area of the body. SYMPTOMS   Swelling and redness of the injured area.  Bruising of the injured area.  Tenderness and soreness of the injured area.  Pain. DIAGNOSIS  The diagnosis can be made by taking a history and physical exam. An X-ray, CT scan, or MRI may be needed to determine if there were any associated injuries, such as fractures. TREATMENT  Specific treatment will depend on what area of the body was injured. In general, the best treatment for a contusion is resting, icing, elevating, and applying cold compresses to the injured area. Over-the-counter medicines may also be recommended for pain control. Ask your caregiver what the best treatment is for your contusion. HOME CARE INSTRUCTIONS   Put ice on the injured area.  Put ice in a plastic bag.  Place a towel between your skin and the bag.  Leave the ice on for 15-20 minutes, 03-04 times a day.  Only take over-the-counter or prescription medicines for pain, discomfort, or fever as directed by your caregiver. Your caregiver may recommend avoiding anti-inflammatory medicines (aspirin, ibuprofen, and naproxen) for 48 hours because these medicines may increase bruising.  Rest the injured area.  If possible, elevate the injured area to reduce swelling. SEEK IMMEDIATE MEDICAL CARE IF:   You have  increased bruising or swelling.  You have pain that is getting worse.  Your swelling or pain is not relieved with medicines. MAKE SURE YOU:   Understand these instructions.  Will watch your condition.  Will get help right away if you are not doing well or get worse. Document Released: 12/28/2004 Document Revised: 06/12/2011 Document Reviewed: 01/23/2011 [facility] Patient Information 2014 [place], [place].

## 2013-04-30 NOTE — ED Notes (Addendum)
C/o left shoulder pain due to falling Granddaughter states patient stumble up the stairs and hit his left shoulder

## 2013-04-30 NOTE — ED Provider Notes (Signed)
Luke Montgomery is a 80 y.o. male who presents to Urgent Care today for follow shoulder pain. Patient fell going up a set of stairs today at about 5 PM. He has mild left shoulder pain. He denies any radiating pain weakness or numbness. He notes some pain with overhand motion.  However his history is somewhat confused by the patient's moderate Alzheimer's dementia. His family states that they heard him fall.   Past Medical History  Diagnosis Date  . Dementia   . DVT (deep venous thrombosis)   . Pulmonary embolism    History  Substance Use Topics  . Smoking status: Never Smoker   . Smokeless tobacco: Not on file  . Alcohol Use: No   ROS as above Medications: No current facility-administered medications for this encounter.   Current Outpatient Prescriptions  Medication Sig Dispense Refill  . diclofenac sodium (VOLTAREN) 1 % GEL Apply 1 application topically 4 (four) times daily.  100 g  0  . Donepezil HCl (ARICEPT PO) Take by mouth.      . Escitalopram Oxalate (LEXAPRO PO) Take by mouth.      . RisperiDONE (RISPERDAL PO) Take by mouth.      . traMADol (ULTRAM) 50 MG tablet Take 1 tablet (50 mg total) by mouth every 6 (six) hours as needed.  15 tablet  0  . Warfarin Sodium (COUMADIN PO) Take by mouth.        Exam:  BP 171/85  Pulse 55  Temp(Src) 97.7 F (36.5 C) (Oral)  Resp 30  SpO2 95% Gen: Well NAD LEFT SHOULDER: Normal-appearing nontender. Active range of motion limited to about 100 abduction. Passive abduction to 140. Negative drop arm sign. Normal range of motion otherwise. Negative impingement testing. Decreased abduction strength bilaterally.. Capillary refill and sensation are intact distally bilateral upper extremities   Left shoulder x-ray. Preliminary read DJD of the glenohumeral and a.c. joints. No fractures or dislocation noted. No results found for this or any previous visit (from the past 24 hour(s)). No results found.  Assessment and Plan: 80 y.o. male  with left shoulder contusion. Patient may have a rotator cuff tear as well. Plan to use Tylenol and tramadol. Followup with Dr. Alfonso Ramus at [place] not improving.  Discussed warning signs or symptoms. Please see discharge instructions. Patient expresses understanding.    Gregor Hams, MD 04/30/13 2123

## 2013-05-11 ENCOUNTER — Emergency Department (HOSPITAL_COMMUNITY): Payer: Medicare Other

## 2013-05-11 ENCOUNTER — Encounter (HOSPITAL_COMMUNITY): Payer: Self-pay | Admitting: Emergency Medicine

## 2013-05-11 ENCOUNTER — Emergency Department (HOSPITAL_COMMUNITY)
Admission: EM | Admit: 2013-05-11 | Discharge: 2013-05-11 | Disposition: A | Discharge status: Home / Self Care | Payer: Medicare Other | Attending: Emergency Medicine | Admitting: Emergency Medicine

## 2013-05-11 DIAGNOSIS — Z86711 Personal history of pulmonary embolism: Secondary | ICD-10-CM | POA: Insufficient documentation

## 2013-05-11 DIAGNOSIS — R0602 Shortness of breath: Secondary | ICD-10-CM | POA: Insufficient documentation

## 2013-05-11 DIAGNOSIS — M25519 Pain in unspecified shoulder: Secondary | ICD-10-CM

## 2013-05-11 DIAGNOSIS — Z7901 Long term (current) use of anticoagulants: Secondary | ICD-10-CM | POA: Insufficient documentation

## 2013-05-11 DIAGNOSIS — Z791 Long term (current) use of non-steroidal anti-inflammatories (NSAID): Secondary | ICD-10-CM | POA: Insufficient documentation

## 2013-05-11 DIAGNOSIS — M79669 Pain in unspecified lower leg: Secondary | ICD-10-CM

## 2013-05-11 DIAGNOSIS — W19XXXA Unspecified fall, initial encounter: Secondary | ICD-10-CM

## 2013-05-11 DIAGNOSIS — F0390 Unspecified dementia without behavioral disturbance: Secondary | ICD-10-CM | POA: Insufficient documentation

## 2013-05-11 DIAGNOSIS — Z880 Allergy status to penicillin: Secondary | ICD-10-CM | POA: Insufficient documentation

## 2013-05-11 DIAGNOSIS — M79609 Pain in unspecified limb: Secondary | ICD-10-CM

## 2013-05-11 DIAGNOSIS — Z86718 Personal history of other venous thrombosis and embolism: Secondary | ICD-10-CM | POA: Insufficient documentation

## 2013-05-11 DIAGNOSIS — Z79899 Other long term (current) drug therapy: Secondary | ICD-10-CM | POA: Insufficient documentation

## 2013-05-11 LAB — POCT I-STAT TROPONIN I: Troponin i, poc: 0.02 ng/mL (ref 0.00–0.08)

## 2013-05-11 LAB — CBC
HCT: 49.3 % (ref 39.0–52.0)
Hemoglobin: 17.1 g/dL — ABNORMAL HIGH (ref 13.0–17.0)
MCH: 30.9 pg (ref 26.0–34.0)
MCHC: 34.7 g/dL (ref 30.0–36.0)
MCV: 89 fL (ref 78.0–100.0)
Platelets: 171 10*3/uL (ref 150–400)
RBC: 5.54 MIL/uL (ref 4.22–5.81)
RDW: 13.7 % (ref 11.5–15.5)
WBC: 6.2 10*3/uL (ref 4.0–10.5)

## 2013-05-11 LAB — PROTIME-INR
INR: 3.21 — ABNORMAL HIGH (ref 0.00–1.49)
Prothrombin Time: 31.7 seconds — ABNORMAL HIGH (ref 11.6–15.2)

## 2013-05-11 LAB — BASIC METABOLIC PANEL
BUN: 14 mg/dL (ref 6–23)
CO2: 28 meq/L (ref 19–32)
Calcium: 9.3 mg/dL (ref 8.4–10.5)
Chloride: 102 mEq/L (ref 96–112)
Creatinine, Ser: 0.86 mg/dL (ref 0.50–1.35)
GFR calc Af Amer: 89 mL/min — ABNORMAL LOW (ref >90)
GFR, EST NON AFRICAN AMERICAN: 77 mL/min — AB (ref >90)
Glucose, Bld: 94 mg/dL (ref 70–99)
POTASSIUM: 4.8 meq/L (ref 3.7–5.3)
Sodium: 140 mEq/L (ref 137–147)

## 2013-05-11 LAB — PRO B NATRIURETIC PEPTIDE: Pro B Natriuretic peptide (BNP): 40.6 pg/mL (ref 0–450)

## 2013-05-11 NOTE — ED Notes (Signed)
Pt is here with left shoulder pain that has been bothering him since falling last Thursday and had some bruising on it.  Pt is on coumadin.  Pt is sob, now complaining of pain in calf areas.  Pt is on coumadin for blood clots

## 2013-05-11 NOTE — ED Provider Notes (Signed)
CSN: [id number]     Arrival date & time 05/11/13  1001 History   First MD Initiated Contact with Patient 05/11/13 1130     Chief Complaint  Patient presents with  . Shoulder Pain   (Consider location/radiation/quality/duration/timing/severity/associated sxs/prior Treatment) HPI Comments: Level 5 Caveat - Dementia  Complained of calf pain to daughter today. Similar to prior   Patient is a 80 y.o. male presenting with shoulder pain. The history is provided by the patient. The history is limited by the condition of the patient.  Shoulder Pain This is a new problem. The current episode started more than 1 week ago. The problem occurs constantly. The problem has not changed since onset.Associated symptoms include shortness of breath. Pertinent negatives include no chest pain and no abdominal pain. Nothing aggravates the symptoms. Nothing relieves the symptoms.    Past Medical History  Diagnosis Date  . Dementia   . DVT (deep venous thrombosis)   . Pulmonary embolism    Past Surgical History  Procedure Laterality Date  . Hernia repair    . Small intestine surgery      secondary to removal of recurrent polyp   No family history on file. History  Substance Use Topics  . Smoking status: Never Smoker   . Smokeless tobacco: Not on file  . Alcohol Use: No    Review of Systems  Unable to perform ROS: Dementia  Constitutional: Negative for fever.  Respiratory: Positive for shortness of breath. Negative for cough.   Cardiovascular: Negative for chest pain and leg swelling.  Gastrointestinal: Negative for abdominal pain.  All other systems reviewed and are negative.    Allergies  Dilaudid and Penicillins  Home Medications   Current Outpatient Rx  Name  Route  Sig  Dispense  Refill  . diclofenac sodium (VOLTAREN) 1 % GEL   Topical   Apply 1 application topically 4 (four) times daily.   100 g   0   . Donepezil HCl (ARICEPT PO)   Oral   Take by mouth.         .  Escitalopram Oxalate (LEXAPRO PO)   Oral   Take by mouth.         . RisperiDONE (RISPERDAL PO)   Oral   Take by mouth.         . traMADol (ULTRAM) 50 MG tablet   Oral   Take 1 tablet (50 mg total) by mouth every 6 (six) hours as needed.   15 tablet   0   . Warfarin Sodium (COUMADIN PO)   Oral   Take by mouth.          BP 135/92  Pulse 51  Temp(Src) 97.5 F (36.4 C) (Oral)  Resp 18  SpO2 98% Physical Exam  Nursing note and vitals reviewed. Constitutional: He is oriented to person, place, and time. He appears well-developed and well-nourished. No distress.  HENT:  Head: Normocephalic and atraumatic.  Mouth/Throat: Oropharynx is clear and moist. No oropharyngeal exudate.  Eyes: EOM are normal. Pupils are equal, round, and reactive to light.  Neck: Normal range of motion. Neck supple.  Cardiovascular: Normal rate and regular rhythm.  Exam reveals no friction rub.   No murmur heard. Pulmonary/Chest: Effort normal and breath sounds normal. No respiratory distress. He has no wheezes. He has no rales.  Abdominal: He exhibits no distension. There is no tenderness. There is no rebound.  Musculoskeletal: He exhibits no edema.       Left shoulder: He  exhibits decreased range of motion and tenderness (upper humerus diffusely).       Arms: L arm with normal pulse, normal sensation, normal motor function distally.  Neurological: He is alert and oriented to person, place, and time. He exhibits normal muscle tone.  Skin: No rash noted. He is not diaphoretic.    ED Course  Procedures (including critical care time) Labs Review Labs Reviewed  CBC - Abnormal; Notable for the following:    Hemoglobin 17.1 (*)    All other components within normal limits  BASIC METABOLIC PANEL - Abnormal; Notable for the following:    GFR calc non Af Amer 77 (*)    GFR calc Af Amer 89 (*)    All other components within normal limits  PROTIME-INR - Abnormal; Notable for the following:     Prothrombin Time 31.7 (*)    INR 3.21 (*)    All other components within normal limits  POCT I-STAT TROPONIN I   Imaging Review Dg Chest 2 View  05/11/2013   CLINICAL DATA:  Shortness of breath. Intermittent substernal chest pain. Fell today.  EXAM: CHEST  2 VIEW  COMPARISON:  Report from [facility] dated 06/08/2010. The most recent radiographs available for comparison are from [facility], dated 01/26/2004.  FINDINGS: Interval mild enlargement of the cardiac silhouette increased prominence of the pulmonary vasculature, both accentuated by a decreased inspiration. Clear lungs. No fracture or pneumothorax seen. Thoracic spine degenerative changes.  IMPRESSION: Cardiomegaly and pulmonary vascular congestion.   Electronically Signed   By: Enrique Sack M.D.   On: 05/11/2013 12:36   Dg Shoulder Left  05/11/2013   CLINICAL DATA:  Fall today with bruising.  EXAM: LEFT SHOULDER - 2+ VIEW  COMPARISON:  DG SHOULDER*L* dated 04/30/2013  FINDINGS: Degenerative irregularity of the acromioclavicular joint and rotator cuff insertion. Glenohumeral joint osteoarthritis as well. No acute fracture or dislocation. Humeral head projects minimally anterior to the glenoid on the scapular view. This is favored to be projectional/positional. Visualized portion of the left hemithorax is normal.  IMPRESSION: Degenerative change, without acute osseous finding.   Electronically Signed   By: Abigail Miyamoto M.D.   On: 05/11/2013 12:42    EKG Interpretation    Date/Time:  Sunday May 11 2013 10:32:14 EST Ventricular Rate:  54 PR Interval:  128 QRS Duration: 92 QT Interval:  442 QTC Calculation: 419 R Axis:   69 Text Interpretation:  Sinus bradycardia with Premature supraventricular complexes Otherwise normal ECG similar to previous Confirmed by Mingo Amber  MD, [place] ([id number]) on 05/11/2013 2:37:26 PM          VASCULAR LAB  PRELIMINARY PRELIMINARY PRELIMINARY PRELIMINARY  Bilateral lower  extremity venous Dopplers completed.  Preliminary report: There is no obvious evidence of DVT or SVT noted in the bilateral lower extremities.  KANADY, CANDACE, RVT  05/11/2013, 1:25 PM   MDM   1. Fall   2. Shoulder pain   3. Calf pain    16M with hx of dementia, DVT on coumadin presents with calf pain. Bilateral calf pain per daughter. He said this happened today. He does state not have pain today here. No swelling. He does say some shortness of breath his daughter this morning, no shortness of breath now. Denies seizures or breath or chest pain this time. His history is unreliable due to his dementia. 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 Patient fell 1 week ago had  x-ray that time of his left shoulder which is negative. He still having decreased range of motion left shoulder due to pain. We'll rex-ray to see if any fractures of become obvious. He has extensive bruising of his left shoulder likely secondary to Coumadin use. Daughter states bruising is improving we will ultrasound his lites today. Also check basic labs and chest x-ray. CXR shows increased vascular congestion. No hx of CHF. Will check BNP today.  INR is 3.21, mildly supratherapeutic. Has f/u with PCP, instructed to talk to PCP for coumadin adjustment. No DVT. Repeat shoulder film normal. Labs normal, BNP normal. Stable for discharge.   Osvaldo Shipper, MD 05/11/13 [id number]

## 2013-05-11 NOTE — Progress Notes (Signed)
VASCULAR LAB PRELIMINARY  PRELIMINARY  PRELIMINARY  PRELIMINARY  Bilateral lower extremity venous Dopplers completed.    Preliminary report:  There is no obvious evidence of DVT or SVT noted in the bilateral lower extremities.  Dajana Gehrig, RVT 05/11/2013, 1:25 PM

## 2013-05-11 NOTE — ED Notes (Signed)
Patient discharged to home with family. NAD.  

## 2013-05-11 NOTE — Discharge Instructions (Signed)
Fall Prevention and Home Safety Falls cause injuries and can affect all age groups. It is possible to use preventive measures to significantly decrease the likelihood of falls. There are many simple measures which can make your home safer and prevent falls. OUTDOORS  Repair cracks and edges of walkways and driveways.  Remove high doorway thresholds.  Trim shrubbery on the main path into your home.  Have good outside lighting.  Clear walkways of tools, rocks, debris, and clutter.  Check that handrails are not broken and are securely fastened. Both sides of steps should have handrails.  Have leaves, snow, and ice cleared regularly.  Use sand or salt on walkways during winter months.  In the garage, clean up grease or oil spills. BATHROOM  Install night lights.  Install grab bars by the toilet and in the tub and shower.  Use non-skid mats or decals in the tub or shower.  Place a plastic non-slip stool in the shower to sit on, if needed.  Keep floors dry and clean up all water on the floor immediately.  Remove soap buildup in the tub or shower on a regular basis.  Secure bath mats with non-slip, double-sided rug tape.  Remove throw rugs and tripping hazards from the floors. BEDROOMS  Install night lights.  Make sure a bedside light is easy to reach.  Do not use oversized bedding.  Keep a telephone by your bedside.  Have a firm chair with side arms to use for getting dressed.  Remove throw rugs and tripping hazards from the floor. KITCHEN  Keep handles on pots and pans turned toward the center of the stove. Use back burners when possible.  Clean up spills quickly and allow time for drying.  Avoid walking on wet floors.  Avoid hot utensils and knives.  Position shelves so they are not too high or low.  Place commonly used objects within easy reach.  If necessary, use a sturdy step stool with a grab bar when reaching.  Keep electrical cables out of the  way.  Do not use floor polish or wax that makes floors slippery. If you must use wax, use non-skid floor wax.  Remove throw rugs and tripping hazards from the floor. STAIRWAYS  Never leave objects on stairs.  Place handrails on both sides of stairways and use them. Fix any loose handrails. Make sure handrails on both sides of the stairways are as long as the stairs.  Check carpeting to make sure it is firmly attached along stairs. Make repairs to worn or loose carpet promptly.  Avoid placing throw rugs at the top or bottom of stairways, or properly secure the rug with carpet tape to prevent slippage. Get rid of throw rugs, if possible.  Have an electrician put in a light switch at the top and bottom of the stairs. OTHER FALL PREVENTION TIPS  Wear low-heel or rubber-soled shoes that are supportive and fit well. Wear closed toe shoes.  When using a stepladder, make sure it is fully opened and both spreaders are firmly locked. Do not climb a closed stepladder.  Add color or contrast paint or tape to grab bars and handrails in your home. Place contrasting color strips on first and last steps.  Learn and use mobility aids as needed. Install an electrical emergency response system.  Turn on lights to avoid dark areas. Replace light bulbs that burn out immediately. Get light switches that glow.  Arrange furniture to create clear pathways. Keep furniture in the same place.  Firmly attach carpet with non-skid or double-sided tape. °· Eliminate uneven floor surfaces. °· Select a carpet pattern that does not visually hide the edge of steps. °· Be aware of all pets. °OTHER HOME SAFETY TIPS °· Set the water temperature for 120° F (48.8° C). °· Keep emergency numbers on or near the telephone. °· Keep smoke detectors on every level of the home and near sleeping areas. °Document Released: 03/10/2002 Document Revised: 09/19/2011 Document Reviewed: 06/09/2011 °[employer]® Patient Information ©2014  [employer]. ° °Musculoskeletal Pain °Musculoskeletal pain is muscle and boney aches and pains. These pains can occur in any part of the body. Your caregiver may treat you without knowing the cause of the pain. They may treat you if blood or urine tests, X-rays, and other tests were normal.  °CAUSES °There is often not a definite cause or reason for these pains. These pains may be caused by a type of germ (virus). The discomfort may also come from overuse. Overuse includes working out too hard when your body is not fit. Boney aches also come from weather changes. Bone is sensitive to atmospheric pressure changes. °HOME CARE INSTRUCTIONS  °· Ask when your test results will be ready. Make sure you get your test results. °· Only take over-the-counter or prescription medicines for pain, discomfort, or fever as directed by your caregiver. If you were given medications for your condition, do not drive, operate machinery or power tools, or sign legal documents for 24 hours. Do not drink alcohol. Do not take sleeping pills or other medications that may interfere with treatment. °· Continue all activities unless the activities cause more pain. When the pain lessens, slowly resume normal activities. Gradually increase the intensity and duration of the activities or exercise. °· During periods of severe pain, bed rest may be helpful. Lay or sit in any position that is comfortable. °· Putting ice on the injured area. °· Put ice in a bag. °· Place a towel between your skin and the bag. °· Leave the ice on for 15 to 20 minutes, 3 to 4 times a day. °· Follow up with your caregiver for continued problems and no reason can be found for the pain. If the pain becomes worse or does not go away, it may be necessary to repeat tests or do additional testing. Your caregiver may need to look further for a possible cause. °SEEK IMMEDIATE MEDICAL CARE IF: °· You have pain that is getting worse and is not relieved by medications. °· You  develop chest pain that is associated with shortness or breath, sweating, feeling sick to your stomach (nauseous), or throw up (vomit). °· Your pain becomes localized to the abdomen. °· You develop any new symptoms that seem different or that concern you. °MAKE SURE YOU:  °· Understand these instructions. °· Will watch your condition. °· Will get help right away if you are not doing well or get worse. °Document Released: 03/20/2005 Document Revised: 06/12/2011 Document Reviewed: 11/22/2012 °[employer]® Patient Information ©2014 [employer]. ° °

## 2014-04-16 DIAGNOSIS — Z7901 Long term (current) use of anticoagulants: Secondary | ICD-10-CM | POA: Diagnosis not present

## 2014-04-16 DIAGNOSIS — Z86711 Personal history of pulmonary embolism: Secondary | ICD-10-CM | POA: Diagnosis not present

## 2014-05-14 DIAGNOSIS — Z86711 Personal history of pulmonary embolism: Secondary | ICD-10-CM | POA: Diagnosis not present

## 2014-05-14 DIAGNOSIS — Z7901 Long term (current) use of anticoagulants: Secondary | ICD-10-CM | POA: Diagnosis not present

## 2014-06-11 DIAGNOSIS — Z86711 Personal history of pulmonary embolism: Secondary | ICD-10-CM | POA: Diagnosis not present

## 2014-06-11 DIAGNOSIS — Z7901 Long term (current) use of anticoagulants: Secondary | ICD-10-CM | POA: Diagnosis not present

## 2014-07-13 DIAGNOSIS — Z86711 Personal history of pulmonary embolism: Secondary | ICD-10-CM | POA: Diagnosis not present

## 2014-07-13 DIAGNOSIS — Z7901 Long term (current) use of anticoagulants: Secondary | ICD-10-CM | POA: Diagnosis not present

## 2014-08-11 DIAGNOSIS — Z86711 Personal history of pulmonary embolism: Secondary | ICD-10-CM | POA: Diagnosis not present

## 2014-08-11 DIAGNOSIS — Z7901 Long term (current) use of anticoagulants: Secondary | ICD-10-CM | POA: Diagnosis not present

## 2014-09-08 DIAGNOSIS — Z7901 Long term (current) use of anticoagulants: Secondary | ICD-10-CM | POA: Diagnosis not present

## 2014-09-08 DIAGNOSIS — Z86711 Personal history of pulmonary embolism: Secondary | ICD-10-CM | POA: Diagnosis not present

## 2014-10-08 DIAGNOSIS — Z7901 Long term (current) use of anticoagulants: Secondary | ICD-10-CM | POA: Diagnosis not present

## 2014-10-08 DIAGNOSIS — Z86711 Personal history of pulmonary embolism: Secondary | ICD-10-CM | POA: Diagnosis not present

## 2014-10-26 DIAGNOSIS — E780 Pure hypercholesterolemia: Secondary | ICD-10-CM | POA: Diagnosis not present

## 2014-10-26 DIAGNOSIS — Z7901 Long term (current) use of anticoagulants: Secondary | ICD-10-CM | POA: Diagnosis not present

## 2014-10-30 DIAGNOSIS — E780 Pure hypercholesterolemia: Secondary | ICD-10-CM | POA: Diagnosis not present

## 2014-10-30 DIAGNOSIS — Z7901 Long term (current) use of anticoagulants: Secondary | ICD-10-CM | POA: Diagnosis not present

## 2014-10-30 DIAGNOSIS — Z0000 Encounter for general adult medical examination without abnormal findings: Secondary | ICD-10-CM | POA: Diagnosis not present

## 2014-10-30 DIAGNOSIS — Z86711 Personal history of pulmonary embolism: Secondary | ICD-10-CM | POA: Diagnosis not present

## 2014-11-05 DIAGNOSIS — Z7901 Long term (current) use of anticoagulants: Secondary | ICD-10-CM | POA: Diagnosis not present

## 2014-11-05 DIAGNOSIS — Z86711 Personal history of pulmonary embolism: Secondary | ICD-10-CM | POA: Diagnosis not present

## 2014-12-14 DIAGNOSIS — Z86711 Personal history of pulmonary embolism: Secondary | ICD-10-CM | POA: Diagnosis not present

## 2014-12-14 DIAGNOSIS — Z7901 Long term (current) use of anticoagulants: Secondary | ICD-10-CM | POA: Diagnosis not present

## 2015-01-11 DIAGNOSIS — Z7901 Long term (current) use of anticoagulants: Secondary | ICD-10-CM | POA: Diagnosis not present

## 2015-01-11 DIAGNOSIS — Z86711 Personal history of pulmonary embolism: Secondary | ICD-10-CM | POA: Diagnosis not present

## 2015-02-04 DIAGNOSIS — Z86711 Personal history of pulmonary embolism: Secondary | ICD-10-CM | POA: Diagnosis not present

## 2015-02-04 DIAGNOSIS — Z7901 Long term (current) use of anticoagulants: Secondary | ICD-10-CM | POA: Diagnosis not present

## 2015-03-04 DIAGNOSIS — Z86711 Personal history of pulmonary embolism: Secondary | ICD-10-CM | POA: Diagnosis not present

## 2015-03-04 DIAGNOSIS — Z7901 Long term (current) use of anticoagulants: Secondary | ICD-10-CM | POA: Diagnosis not present

## 2015-10-11 ENCOUNTER — Emergency Department (HOSPITAL_COMMUNITY): Payer: Medicare HMO

## 2015-10-11 ENCOUNTER — Encounter (HOSPITAL_COMMUNITY): Payer: Self-pay | Admitting: Emergency Medicine

## 2015-10-11 ENCOUNTER — Emergency Department (HOSPITAL_COMMUNITY)
Admission: EM | Admit: 2015-10-11 | Discharge: 2015-10-13 | Disposition: A | Discharge status: Other Acute Inpatient Hospital | Payer: Medicare HMO | Attending: Emergency Medicine | Admitting: Emergency Medicine

## 2015-10-11 DIAGNOSIS — F03918 Unspecified dementia, unspecified severity, with other behavioral disturbance: Secondary | ICD-10-CM | POA: Diagnosis present

## 2015-10-11 DIAGNOSIS — R451 Restlessness and agitation: Secondary | ICD-10-CM | POA: Insufficient documentation

## 2015-10-11 DIAGNOSIS — W501XXA Accidental kick by another person, initial encounter: Secondary | ICD-10-CM | POA: Diagnosis not present

## 2015-10-11 DIAGNOSIS — Y999 Unspecified external cause status: Secondary | ICD-10-CM | POA: Diagnosis not present

## 2015-10-11 DIAGNOSIS — Y939 Activity, unspecified: Secondary | ICD-10-CM | POA: Insufficient documentation

## 2015-10-11 DIAGNOSIS — S40811A Abrasion of right upper arm, initial encounter: Secondary | ICD-10-CM | POA: Diagnosis not present

## 2015-10-11 DIAGNOSIS — Z79899 Other long term (current) drug therapy: Secondary | ICD-10-CM | POA: Insufficient documentation

## 2015-10-11 DIAGNOSIS — Z7901 Long term (current) use of anticoagulants: Secondary | ICD-10-CM | POA: Diagnosis not present

## 2015-10-11 DIAGNOSIS — Y929 Unspecified place or not applicable: Secondary | ICD-10-CM | POA: Insufficient documentation

## 2015-10-11 DIAGNOSIS — Z86718 Personal history of other venous thrombosis and embolism: Secondary | ICD-10-CM | POA: Insufficient documentation

## 2015-10-11 DIAGNOSIS — Z86711 Personal history of pulmonary embolism: Secondary | ICD-10-CM | POA: Insufficient documentation

## 2015-10-11 DIAGNOSIS — F0391 Unspecified dementia with behavioral disturbance: Secondary | ICD-10-CM | POA: Insufficient documentation

## 2015-10-11 LAB — RAPID URINE DRUG SCREEN, HOSP PERFORMED
AMPHETAMINES: NOT DETECTED
BENZODIAZEPINES: NOT DETECTED
Barbiturates: NOT DETECTED
COCAINE: NOT DETECTED
OPIATES: NOT DETECTED
Tetrahydrocannabinol: NOT DETECTED

## 2015-10-11 LAB — CBC WITH DIFFERENTIAL/PLATELET
Basophils Absolute: 0 10*3/uL (ref 0.0–0.1)
Basophils Relative: 0 %
EOS PCT: 2 %
Eosinophils Absolute: 0.1 10*3/uL (ref 0.0–0.7)
HEMATOCRIT: 43.7 % (ref 39.0–52.0)
Hemoglobin: 14.7 g/dL (ref 13.0–17.0)
LYMPHS PCT: 20 %
Lymphs Abs: 1.1 10*3/uL (ref 0.7–4.0)
MCH: 30.2 pg (ref 26.0–34.0)
MCHC: 33.6 g/dL (ref 30.0–36.0)
MCV: 89.9 fL (ref 78.0–100.0)
MONO ABS: 0.9 10*3/uL (ref 0.1–1.0)
Monocytes Relative: 17 %
NEUTROS ABS: 3.4 10*3/uL (ref 1.7–7.7)
Neutrophils Relative %: 61 %
Platelets: 141 10*3/uL — ABNORMAL LOW (ref 150–400)
RBC: 4.86 MIL/uL (ref 4.22–5.81)
RDW: 13.9 % (ref 11.5–15.5)
WBC: 5.5 10*3/uL (ref 4.0–10.5)

## 2015-10-11 LAB — COMPREHENSIVE METABOLIC PANEL
ALT: 19 U/L (ref 17–63)
AST: 18 U/L (ref 15–41)
Albumin: 3.7 g/dL (ref 3.5–5.0)
Alkaline Phosphatase: 72 U/L (ref 38–126)
Anion gap: 8 (ref 5–15)
BILIRUBIN TOTAL: 1 mg/dL (ref 0.3–1.2)
BUN: 16 mg/dL (ref 6–20)
CO2: 24 mmol/L (ref 22–32)
Calcium: 8.6 mg/dL — ABNORMAL LOW (ref 8.9–10.3)
Chloride: 106 mmol/L (ref 101–111)
Creatinine, Ser: 0.86 mg/dL (ref 0.61–1.24)
Glucose, Bld: 91 mg/dL (ref 65–99)
POTASSIUM: 3.9 mmol/L (ref 3.5–5.1)
Sodium: 138 mmol/L (ref 135–145)
TOTAL PROTEIN: 6.1 g/dL — AB (ref 6.5–8.1)

## 2015-10-11 LAB — AMMONIA: AMMONIA: 14 umol/L (ref 9–35)

## 2015-10-11 LAB — URINALYSIS, ROUTINE W REFLEX MICROSCOPIC
Bilirubin Urine: NEGATIVE
Glucose, UA: NEGATIVE mg/dL
Hgb urine dipstick: NEGATIVE
Ketones, ur: NEGATIVE mg/dL
LEUKOCYTES UA: NEGATIVE
NITRITE: NEGATIVE
PROTEIN: NEGATIVE mg/dL
Specific Gravity, Urine: 1.013 (ref 1.005–1.030)
pH: 6.5 (ref 5.0–8.0)

## 2015-10-11 LAB — ETHANOL: Alcohol, Ethyl (B): <5 mg/dL (ref <5)

## 2015-10-11 MED ORDER — DONEPEZIL HCL 5 MG PO TABS: 10.0000 mg | ORAL_TABLET | Freq: Every day | ORAL | Status: DC

## 2015-10-11 MED ORDER — HALOPERIDOL LACTATE 5 MG/ML IJ SOLN
5.0000 mg | Freq: Four times a day (QID) | INTRAMUSCULAR | Status: DC | PRN
  Administered 2015-10-11: 5 mg via INTRAMUSCULAR
  Filled 2015-10-11: qty 1

## 2015-10-11 MED ORDER — TAMSULOSIN HCL 0.4 MG PO CAPS
0.4000 mg | ORAL_CAPSULE | Freq: Every day | ORAL | Status: DC
  Administered 2015-10-13: 0.4 mg via ORAL
  Filled 2015-10-11 (×2): qty 1

## 2015-10-11 MED ORDER — QUETIAPINE FUMARATE 25 MG PO TABS: 75.0000 mg | ORAL_TABLET | Freq: Every day | ORAL | Status: DC

## 2015-10-11 MED ORDER — HALOPERIDOL LACTATE 5 MG/ML IJ SOLN
INTRAMUSCULAR | Status: AC
  Filled 2015-10-11: qty 1

## 2015-10-11 MED ORDER — TRAMADOL HCL 50 MG PO TABS: 50.0000 mg | ORAL_TABLET | Freq: Four times a day (QID) | ORAL | Status: DC | PRN

## 2015-10-11 MED ORDER — HALOPERIDOL LACTATE 5 MG/ML IJ SOLN
INTRAMUSCULAR | Status: AC
  Filled 2015-10-11: qty 3

## 2015-10-11 MED ORDER — QUETIAPINE FUMARATE 25 MG PO TABS: 12.5000 mg | ORAL_TABLET | Freq: Every day | ORAL | Status: DC

## 2015-10-11 MED ORDER — WARFARIN SODIUM 2.5 MG PO TABS: 2.5000 mg | ORAL_TABLET | Freq: Every day | ORAL | Status: DC

## 2015-10-11 MED ORDER — CITALOPRAM HYDROBROMIDE 10 MG PO TABS
20.0000 mg | ORAL_TABLET | Freq: Every day | ORAL | Status: DC
  Administered 2015-10-12: 20 mg via ORAL
  Filled 2015-10-11: qty 2

## 2015-10-11 MED ORDER — HALOPERIDOL LACTATE 5 MG/ML IJ SOLN
2.0000 mg | Freq: Once | INTRAMUSCULAR | Status: AC
  Administered 2015-10-11: 5 mg via INTRAVENOUS

## 2015-10-11 MED ORDER — QUETIAPINE FUMARATE 25 MG PO TABS
25.0000 mg | ORAL_TABLET | Freq: Every morning | ORAL | Status: DC
  Administered 2015-10-12: 25 mg via ORAL
  Filled 2015-10-11: qty 1

## 2015-10-11 MED ORDER — DIPHENHYDRAMINE HCL 50 MG/ML IJ SOLN
25.0000 mg | Freq: Four times a day (QID) | INTRAMUSCULAR | Status: DC | PRN
  Administered 2015-10-11: 25 mg via INTRAMUSCULAR
  Filled 2015-10-11: qty 1

## 2015-10-11 MED ORDER — WARFARIN - PHYSICIAN DOSING INPATIENT: Freq: Every day | Status: DC

## 2015-10-11 MED ORDER — QUETIAPINE FUMARATE 25 MG PO TABS: 12.5000 mg | ORAL_TABLET | Freq: Every evening | ORAL | Status: DC

## 2015-10-11 MED ORDER — DIPHENHYDRAMINE HCL 25 MG PO CAPS
25.0000 mg | ORAL_CAPSULE | Freq: Once | ORAL | Status: AC
  Administered 2015-10-11: 25 mg via ORAL
  Filled 2015-10-11: qty 1

## 2015-10-11 NOTE — ED Notes (Signed)
Refuses to put on BP cuff.
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
## 2015-10-11 NOTE — ED Notes (Addendum)
Patient getting agitated and aggressive while in CT.  Made Dr Alfonse Spruce aware.

## 2015-10-11 NOTE — ED Notes (Signed)
MD at bedside talking to family
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
## 2015-10-11 NOTE — ED Notes (Signed)
Per Dr Alfonse Spruce, can hold off on blood work at this time until patient is more calm and cooperative.

## 2015-10-11 NOTE — ED Notes (Signed)
Patient came back from bathroom with daughter to ED room, patient began to get agitated and started yelling, demanding to go home.  Patient was directed to sit on side of bed.

## 2015-10-11 NOTE — ED Notes (Signed)
TTS MD at bedside.

## 2015-10-11 NOTE — BH Assessment (Signed)
[place] Assessment Progress Note   Case was staffed with Reita Cliche DNP who recommended inpatient admission as Gero-Psych placement is investigated.

## 2015-10-11 NOTE — ED Notes (Signed)
Bed: [id number] Expected date:  Expected time:  Means of arrival:  Comments: tr2

## 2015-10-11 NOTE — ED Notes (Addendum)
Patient changed into scrubs and wanded by Security.  Patient's daughter took patient's belongings.

## 2015-10-12 DIAGNOSIS — F03918 Unspecified dementia, unspecified severity, with other behavioral disturbance: Secondary | ICD-10-CM | POA: Diagnosis present

## 2015-10-12 DIAGNOSIS — F0391 Unspecified dementia with behavioral disturbance: Secondary | ICD-10-CM

## 2015-10-12 LAB — PROTIME-INR
INR: 1.82 — ABNORMAL HIGH (ref 0.00–1.49)
Prothrombin Time: 21 seconds — ABNORMAL HIGH (ref 11.6–15.2)

## 2015-10-12 MED ORDER — QUETIAPINE FUMARATE 50 MG PO TABS
50.0000 mg | ORAL_TABLET | Freq: Two times a day (BID) | ORAL | Status: DC
  Administered 2015-10-12 – 2015-10-13 (×3): 50 mg via ORAL
  Filled 2015-10-12 (×3): qty 1

## 2015-10-12 MED ORDER — MEMANTINE HCL 10 MG PO TABS
10.0000 mg | ORAL_TABLET | Freq: Every day | ORAL | Status: DC
  Administered 2015-10-12 – 2015-10-13 (×2): 10 mg via ORAL
  Filled 2015-10-12 (×3): qty 1

## 2015-10-12 MED ORDER — WARFARIN SODIUM 5 MG PO TABS
5.0000 mg | ORAL_TABLET | Freq: Once | ORAL | Status: AC
  Administered 2015-10-12: 5 mg via ORAL
  Filled 2015-10-12 (×2): qty 1

## 2015-10-12 MED ORDER — CITALOPRAM HYDROBROMIDE 10 MG PO TABS
10.0000 mg | ORAL_TABLET | Freq: Every day | ORAL | Status: DC
  Administered 2015-10-13: 10 mg via ORAL
  Filled 2015-10-12: qty 1

## 2015-10-12 MED ORDER — WARFARIN - PHARMACIST DOSING INPATIENT: Freq: Every day | Status: DC

## 2015-10-12 MED ORDER — DIVALPROEX SODIUM 250 MG PO DR TAB
250.0000 mg | DELAYED_RELEASE_TABLET | Freq: Two times a day (BID) | ORAL | Status: DC
  Administered 2015-10-13 (×2): 250 mg via ORAL
  Filled 2015-10-12 (×3): qty 1

## 2015-10-12 MED ORDER — WARFARIN SODIUM 5 MG PO TABS: 5.0000 mg | ORAL_TABLET | Freq: Once | ORAL | Status: DC

## 2015-10-12 MED ORDER — LORAZEPAM 1 MG PO TABS
1.0000 mg | ORAL_TABLET | ORAL | Status: DC | PRN
  Administered 2015-10-12 – 2015-10-13 (×2): 1 mg via ORAL
  Filled 2015-10-12 (×2): qty 1

## 2015-10-12 NOTE — Progress Notes (Signed)
ANTICOAGULATION CONSULT NOTE - Initial Consult  Pharmacy Consult for warfarin Indication: history of DVT, PE  Allergies  Allergen Reactions  . Dilaudid [Hydromorphone Hcl] Other (See Comments)    Out of his mind. Extremely confused.   Marland Kitchen Penicillins Other (See Comments)    Has patient had a PCN reaction causing immediate rash, facial/tongue/throat swelling, SOB or lightheadedness with hypotension: Yes Has patient had a PCN reaction causing severe rash involving mucus membranes or skin necrosis: Unknown Has patient had a PCN reaction that required hospitalization Unknown Has patient had a PCN reaction occurring within the last 10 years: Unknown above answers are "NO", then may proceed with Cephalosporin use.     Vital Signs: Temp: 97.5 F (36.4 C) (07/11 0437) Temp Source: Oral (07/11 0437) BP: 153/120 mmHg (07/11 0437) Pulse Rate: 63 (07/11 0437)  Labs:  Recent Labs  10/11/15 1536 10/12/15 0525  HGB 14.7  --   HCT 43.7  --   PLT 141*  --   LABPROT  --  21.0*  INR  --  1.82*  CREATININE 0.86  --     CrCl cannot be calculated (Unknown ideal weight.).   Medical History: Past Medical History  Diagnosis Date  . Dementia   . DVT (deep venous thrombosis) ([place])   . 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 Patient went to have INR checked and became very combative at PCP office, so was sent to the ED for evaluation. Patient's PTA dose reported per daughter as 5mg  on Monday, Wednesday, and Friday and 2.5mg  on all other days of the week. However, per documentation in med rec, daughter states that often she goes by her "feeling" when dosing pt's warfarin, despite dosing instructions given by provider. Pharmacy consulted today to dose warfarin therapy while patient in the hospital.  Today, 10/12/2015:  INR subtherapeutic today  at 1.82. Note last PTA dose of warfarin given on 10/10/15 at 2230. No warfarin was given yesterday while patient in ED.   CBC (7/10): H/H WNL, Pltc slightly low at 141K (c/w previous values)  No bleeding issues reported per nursing  Regular diet ordered  Drug-drug interactions: Divalproex added today-may enhance anticoagulant activity and transiently increase INR by displacing warfarin from albumin-binding sites   Goal of Therapy:  INR 2-3 Monitor platelets by anticoagulation protocol: Yes   Plan:  Warfarin 5mg  PO x 1 now Daily PT/INR CBC at least q72h while patient remains in the hospital Monitor closely for s/s of bleeding   Lindell Spar, PharmD, BCPS Pager: [phone] 10/12/2015 12:27 PM
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
## 2015-10-12 NOTE — BH Assessment (Signed)
[facility] Assessment Progress Note  Per Corena Pilgrim, MD, this pt requires psychiatric hospitalization at this time. This Probation officer spoke to pt's daughter, Ryken Pudlo [phone]), who is also reportedly his health care power of attorney.  She has been asked to bring documents to the ED.  She ask that this writer seek placement at nearby facilities.  For this reason, for now, search for placement has been restricted.  The following facilities have been contacted to seek placement for this pt, with results as noted:  Beds available, information sent, decision pending:  Osborne O[place], [place] Triage Specialist [phone]

## 2015-10-13 DIAGNOSIS — F0391 Unspecified dementia with behavioral disturbance: Secondary | ICD-10-CM | POA: Diagnosis not present

## 2015-10-13 LAB — PROTIME-INR
INR: 1.87 — AB (ref 0.00–1.49)
PROTHROMBIN TIME: 20.8 s — AB (ref 11.6–15.2)

## 2015-10-13 MED ORDER — WARFARIN SODIUM 5 MG PO TABS
5.0000 mg | ORAL_TABLET | Freq: Once | ORAL | Status: DC
  Filled 2015-10-13: qty 1

## 2015-10-13 NOTE — ED Notes (Addendum)
Patient's daughter came to bedside. Requested additional visitor to stay at bedside. Says that visitor is sitter who stays with patient at home. Daughter reminded of visitor guidelines which only permit 1 visitor at a time. Daughter then states that visitor is for her own personal comfort. Reassured that we will make accommodations for her to be involved in physician rounding. Daughter became tearful and stated that she would call my supervisor.

## 2015-10-13 NOTE — ED Notes (Addendum)
AD Mali came to bedside. Filled in on situation. [place] informed that patient became increasingly agitated when daughter arrived. This was confirmed by security who said that their response was required yesterday as patient became increasingly agitated when daughter was present. Patient was exhibiting similar behavior again today, trying to walk out, looking for shoes, looking for car, raising voice, pushing towards door. Nurse and sitter were able to re-direct patient. [place] spoke to daughter at bedside. I was then instructed by AD Mali that second visitor will be allowed back to sit with patient and for any length of time that family requests.

## 2015-10-13 NOTE — Consult Note (Signed)
[place] [facility] Consult   Reason for Consult:  Combativeness and agitation  Referring Physician:  EDP Patient Identification: Luke Montgomery MRN:  [id number] Principal Diagnosis: Dementia with behavioral disturbance Diagnosis:   Patient Active Problem List   Diagnosis Date Noted  . Dementia with behavioral disturbance [F03.91] 10/12/2015    Priority: High    Total Time spent with patient: 30 minutes  Subjective:   Luke Montgomery is a 82 y.o. male patient admitted with increased agitation.  HPI:  Luke Montgomery, 82 yo, admitted after an incident in is PCP's office wherein patient became extremely agitated.  Patient is seen in the room.  Present is his daughter and day sitter.  Patient ins non verbal but fdoes make eye contact.  Per daughter, patient's behaviors have become increasingly agitated and combative in the last 4-6 months.  Patient has had a history of dementia since the last 8 years.  Per daughter, she had had difficulty controlling his angry mood swings that occur various times of the day and also with his sleep.  Patient is on Tramadol, Celexa and Seroquel.  Patient is a veteran of the [place] War and per chart this has caused some insomnia and random mood swings.  CT scan results showed that patient has a stable mass.  Consulted with Theodosia Paling neurology PA-C and CT scan compared with previous show that the mass is in fact stable.  It was also noted that patient's MRI/CT scan does how frontal lobe dementia that may contribute to patient's worsening mood swings.    Today:  Patient continues to be confused with memory issues, agitated easily with ADLs.  Transfer to [facility] inpatient.  Past Psychiatric History: see HPI  Risk to Self: Suicidal Ideation: No Suicidal Intent: No Is patient at risk for suicide?: No Suicidal Plan?: No Access to Means: No What has been your use of drugs/alcohol within the last 12 months?: Denies How many times?: 0 Other Self Harm Risks:  None Triggers for Past Attempts: Unknown Intentional Self Injurious Behavior: None Risk to Others: Homicidal Ideation: No (no put aggressive behaviors) Thoughts of Harm to Others: Yes-Currently Present Comment - Thoughts of Harm to Others: Assault towards nurse Current Homicidal Intent: No (assault towards nurses) Current Homicidal Plan: No Access to Homicidal Means: No Identified Victim: na History of harm to others?: No Assessment of Violence: On admission Violent Behavior Description: threats toward nurse Does patient have access to weapons?: No Criminal Charges Pending?: No Does patient have a court date: No Prior Inpatient Therapy: Prior Inpatient Therapy: No Prior Therapy Dates: na Prior Therapy Facilty/Provider(s): na Reason for Treatment: na Prior Outpatient Therapy: Prior Outpatient Therapy: No Prior Therapy Dates: na Prior Therapy Facilty/Provider(s): na Reason for Treatment: na Does patient have an ACCT team?: No Does patient have Intensive In-House Services?  : No Does patient have [facility] services? : No Does patient have P4CC services?: No  Past Medical History:  Past Medical History  Diagnosis Date  . Dementia   . DVT (deep venous thrombosis) ([place])   . Pulmonary embolism [facility])     Past Surgical History  Procedure Laterality Date  . Hernia repair    . Small intestine surgery      secondary to removal of recurrent polyp   Family History: No family history on file. Family Psychiatric  History: see HPI Social History:  History  Alcohol Use No     History  Drug Use No    Social History   Social History  .  Marital Status: Married    Spouse Name: N/A  . Number of Children: N/A  . Years of Education: N/A   Social History Main Topics  . Smoking status: Never Smoker   . Smokeless tobacco: None  . Alcohol Use: No  . Drug Use: No  . Sexual Activity: Not Asked   Other Topics Concern  . None   Social History Narrative   Additional Social  History:    Allergies:   Allergies  Allergen Reactions  . Dilaudid [Hydromorphone Hcl] Other (See Comments)    Out of his mind. Extremely confused.   Marland Kitchen Penicillins Other (See Comments)    Has patient had a PCN reaction causing immediate rash, facial/tongue/throat swelling, SOB or lightheadedness with hypotension: Yes Has patient had a PCN reaction causing severe rash involving mucus membranes or skin necrosis: Unknown Has patient had a PCN reaction that required hospitalization Unknown Has patient had a PCN reaction occurring within the last 10 years: Unknown above answers are "NO", then may proceed with Cephalosporin use.     Labs:  Results for orders placed or performed during the hospital encounter of 10/11/15 (from the past 48 hour(s))  CBC with Differential     Status: Abnormal   Collection Time: 10/11/15  3:36 PM  Result Value Ref Range   WBC 5.5 4.0 - 10.5 K/uL   RBC 4.86 4.22 - 5.81 MIL/uL   Hemoglobin 14.7 13.0 - 17.0 g/dL   HCT 43.7 39.0 - 52.0 %   MCV 89.9 78.0 - 100.0 fL   MCH 30.2 26.0 - 34.0 pg   MCHC 33.6 30.0 - 36.0 g/dL   RDW 13.9 11.5 - 15.5 %   Platelets 141 (L) 150 - 400 K/uL   Neutrophils Relative % 61 %   Neutro Abs 3.4 1.7 - 7.7 K/uL   Lymphocytes Relative 20 %   Lymphs Abs 1.1 0.7 - 4.0 K/uL   Monocytes Relative 17 %   Monocytes Absolute 0.9 0.1 - 1.0 K/uL   Eosinophils Relative 2 %   Eosinophils Absolute 0.1 0.0 - 0.7 K/uL   Basophils Relative 0 %   Basophils Absolute 0.0 0.0 - 0.1 K/uL  Comprehensive metabolic panel     Status: Abnormal   Collection Time: 10/11/15  3:36 PM  Result Value Ref Range   Sodium 138 135 - 145 mmol/L   Potassium 3.9 3.5 - 5.1 mmol/L   Chloride 106 101 - 111 mmol/L   CO2 24 22 - 32 mmol/L   Glucose, Bld 91 65 - 99 mg/dL   BUN 16 6 - 20 mg/dL   Creatinine, Ser 0.86 0.61 - 1.24 mg/dL   Calcium 8.6 (L) 8.9 - 10.3 mg/dL   Total Protein 6.1 (L) 6.5 - 8.1 g/dL   Albumin 3.7 3.5 - 5.0 g/dL   AST 18 15 - 41 U/L   ALT 19  17 - 63 U/L   Alkaline Phosphatase 72 38 - 126 U/L   Total Bilirubin 1.0 0.3 - 1.2 mg/dL   GFR calc non Af Amer >60 >60 mL/min   GFR calc Af Amer >60 >60 mL/min    Comment: (NOTE) The eGFR has been calculated using the CKD EPI equation. This calculation has not been validated in all clinical situations. eGFR's persistently <60 mL/min signify possible Chronic Kidney Disease.    Anion gap 8 5 - 15  Ethanol     Status: None   Collection Time: 10/11/15  3:36 PM  Result Value Ref Range  Alcohol, Ethyl (B) <5 <5 mg/dL    Comment:        LOWEST DETECTABLE LIMIT FOR SERUM ALCOHOL IS 5 mg/dL FOR MEDICAL PURPOSES ONLY   Urinalysis, Routine w reflex microscopic (not at [facility])     Status: None   Collection Time: 10/11/15  4:00 PM  Result Value Ref Range   Color, Urine YELLOW YELLOW   APPearance CLEAR CLEAR   Specific Gravity, Urine 1.013 1.005 - 1.030   pH 6.5 5.0 - 8.0   Glucose, UA NEGATIVE NEGATIVE mg/dL   Hgb urine dipstick NEGATIVE NEGATIVE   Bilirubin Urine NEGATIVE NEGATIVE   Ketones, ur NEGATIVE NEGATIVE mg/dL   Protein, ur NEGATIVE NEGATIVE mg/dL   Nitrite NEGATIVE NEGATIVE   Leukocytes, UA NEGATIVE NEGATIVE    Comment: MICROSCOPIC NOT DONE ON URINES WITH NEGATIVE PROTEIN, BLOOD, LEUKOCYTES, NITRITE, OR GLUCOSE <1000 mg/dL.  Rapid urine drug screen (hospital performed)     Status: None   Collection Time: 10/11/15  4:00 PM  Result Value Ref Range   Opiates NONE DETECTED NONE DETECTED   Cocaine NONE DETECTED NONE DETECTED   Benzodiazepines NONE DETECTED NONE DETECTED   Amphetamines NONE DETECTED NONE DETECTED   Tetrahydrocannabinol NONE DETECTED NONE DETECTED   Barbiturates NONE DETECTED NONE DETECTED    Comment:        DRUG SCREEN FOR MEDICAL PURPOSES ONLY.  IF CONFIRMATION IS NEEDED FOR ANY PURPOSE, NOTIFY LAB WITHIN 5 DAYS.        LOWEST DETECTABLE LIMITS FOR URINE DRUG SCREEN Drug Class       Cutoff (ng/mL) Amphetamine      1000 Barbiturate       200 Benzodiazepine   353 Tricyclics       299 Opiates          300 Cocaine          300 THC              50   Ammonia     Status: None   Collection Time: 10/11/15  4:45 PM  Result Value Ref Range   Ammonia 14 9 - 35 umol/L  Protime-INR     Status: Abnormal   Collection Time: 10/12/15  5:25 AM  Result Value Ref Range   Prothrombin Time 21.0 (H) 11.6 - 15.2 seconds   INR 1.82 (H) 0.00 - 1.49  Protime-INR     Status: Abnormal   Collection Time: 10/13/15  4:59 AM  Result Value Ref Range   Prothrombin Time 20.8 (H) 11.6 - 15.2 seconds   INR 1.87 (H) 0.00 - 1.49    Current Facility-Administered Medications  Medication Dose Route Frequency Provider Last Rate Last Dose  . citalopram (CELEXA) tablet 10 mg  10 mg Oral Daily Corena Pilgrim, MD   10 mg at 10/13/15 0857  . divalproex (DEPAKOTE) DR tablet 250 mg  250 mg Oral BID PC Shep Porter, MD   250 mg at 10/13/15 0858  . LORazepam (ATIVAN) tablet 1 mg  1 mg Oral Q4H PRN Corena Pilgrim, MD   1 mg at 10/12/15 1636  . memantine (NAMENDA) tablet 10 mg  10 mg Oral Daily Corena Pilgrim, MD   10 mg at 10/12/15 1451  . QUEtiapine (SEROQUEL) tablet 50 mg  50 mg Oral BID Corena Pilgrim, MD   50 mg at 10/13/15 0858  . tamsulosin (FLOMAX) capsule 0.4 mg  0.4 mg Oral QHS Margette Fast, MD   0.4 mg at 10/13/15 0242  . traMADol Veatrice Bourbon)  tablet 50 mg  50 mg Oral Q6H PRN Margette Fast, MD      . warfarin (COUMADIN) tablet 5 mg  5 mg Oral ONCE-1800 Luiz Ochoa, [facility]      . Warfarin - Pharmacist Dosing Inpatient   Does not apply [place], [facility]       Current Outpatient Prescriptions  Medication Sig Dispense Refill  . citalopram (CELEXA) 40 MG tablet Take 20 mg by mouth at bedtime.     . donepezil (ARICEPT) 10 MG tablet Take 10 mg by mouth at bedtime.    Marland Kitchen QUEtiapine (SEROQUEL) 25 MG tablet Take 25-75 mg by mouth See admin instructions. Take 93m at 0800, take 12.5 mg at 1230, take 12.5 mg at 1700, and 75 mg at 2200.    . tamsulosin  (FLOMAX) 0.4 MG CAPS capsule Take 0.4 mg by mouth at bedtime.    . traMADol (ULTRAM) 50 MG tablet Take 50 mg by mouth every 6 (six) hours as needed for moderate pain.    . Warfarin Sodium (COUMADIN PO) Take 2.5-5 mg by mouth daily. Take 5 mg on Monday, Wednesday, and Friday.  Take 2.5 mg all other days of the week.      Musculoskeletal: Strength & Muscle Tone: within normal limits Gait & Station: normal Patient leans: N/A  Psychiatric Specialty Exam:  Information gathered with assistance of his daughters who are at his bedside. Physical Exam  Vitals reviewed. Constitutional: He is oriented to person, place, and time. He appears well-developed and well-nourished.  HENT:  Head: Normocephalic.  Neck: Normal range of motion.  Respiratory: Effort normal.  Musculoskeletal: Normal range of motion.  Neurological: He is alert and oriented to person, place, and time.  Skin: Skin is warm and dry.  Psychiatric: His speech is normal and behavior is normal. Thought content normal. His mood appears anxious. His affect is blunt and labile. Cognition and memory are impaired. He expresses impulsivity. He exhibits a depressed mood.    Review of Systems  Constitutional: Negative.   HENT: Negative.   Eyes: Negative.   Respiratory: Negative.   Cardiovascular: Negative.   Gastrointestinal: Negative.   Genitourinary: Negative.   Musculoskeletal: Negative.   Skin: Negative.   Neurological: Negative.   Endo/Heme/Allergies: Negative.   Psychiatric/Behavioral: Positive for depression and memory loss. The patient is nervous/anxious.     Blood pressure 115/76, pulse 115, temperature 98.5 F (36.9 C), temperature source Oral, resp. rate 16, SpO2 99 %.There is no height or weight on file to calculate BMI.  General Appearance: Disheveled  Eye Contact:  Fair  Speech:  Blocked  Volume:  NA  Mood:  Anxious  Affect:  Flat  Thought Process:  NA  Orientation:  Other:  non verbal dementia  Thought Content:   Rumination  Suicidal Thoughts:  No  Homicidal Thoughts:  No  Memory:  Immediate;   Fair Recent;   Fair Remote;   Fair  Judgement:  Fair  Insight:  Fair  Psychomotor Activity:  Normal and patient in chair, tremors noted  Concentration:  Concentration: non verbal and Attention Span: non verbal  Recall:  non verbal  Fund of Knowledge:  dementia  Language:  Poor  Akathisia:  Negative  Handed:  Right  AIMS (if indicated):     Assets:  Social Support  ADL's:  Impaired  Cognition:  Impaired,  Severe  Sleep:  Poor per daughter and pCharity fundraiser  Treatment Plan Summary: Daily contact with patient to assess and  evaluate symptoms and progress in treatment, Medication management and Plan seek [facility]  psych placement:  Dementia with behavioral disturbances: -Crisis stabilization -Medication management:  Continue Celexa 10 mg daily for depression, Depakote 250 mg BID for mood stabilization, Ativan 1 mg every four hours PRN agitation, Namenda 10 mg daily for dementia, Seroquel 50 mg BID for mood stabilization along with his medical medications. -Individual counseling  Disposition: Recommend psychiatric Inpatient admission when medically cleared.  Waylan Boga, NP [facility] 10/13/2015 11:23 AM Patient seen face-to-face for psychiatric evaluation, chart reviewed and case discussed with the physician extender and developed treatment plan. Reviewed the information documented and agree with the treatment plan. Corena Pilgrim, MD

## 2015-10-13 NOTE — BH Assessment (Addendum)
[facility] Assessment Progress Note  Per Corena Pilgrim, MD, this pt continues to require psychiatric hospitalization at this time.  At 10:23 Carlyon Shadow calls from [facility] to report that pt has been pre-accepted to their facility by Dr Nyoka Cowden in anticipation of  bed 5450-1 becoming available later today.  Darlene will call when they are ready for pt to be transferred.  Dr Darleene Cleaver concurs with this decision.  This Probation officer will call pt's daughter/guardian to discuss details with her.  Jalene Mullet, MA Triage Specialist [phone]   Addendum:  At 13:05 [place] calls back from [facility], and they are now ready to receive pt.  Please call report to [phone].  Pt's daughter/guardian is on hand.  She concurs with this decision and has signed consent for admission for pt, which has been faxed to [phone]; Carlyon Shadow confirms receipt.  Pt's nurse agrees to call report as pt is about to depart from [facility], and to send original signed document with pt.  Pt is to be transported via Stacey Drain, [place] Triage Specialist [phone]

## 2015-10-13 NOTE — Progress Notes (Signed)
ANTICOAGULATION CONSULT NOTE - [facility] for warfarin Indication: history of DVT, PE  Allergies  Allergen Reactions  . Dilaudid [Hydromorphone Hcl] Other (See Comments)    Out of his mind. Extremely confused.   Marland Kitchen Penicillins Other (See Comments)    Has patient had a PCN reaction causing immediate rash, facial/tongue/throat swelling, SOB or lightheadedness with hypotension: Yes Has patient had a PCN reaction causing severe rash involving mucus membranes or skin necrosis: Unknown Has patient had a PCN reaction that required hospitalization Unknown Has patient had a PCN reaction occurring within the last 10 years: Unknown above answers are "NO", then may proceed with Cephalosporin use.     Vital Signs: Temp: 98.5 F (36.9 C) (07/12 0659) Temp Source: Oral (07/12 0659) BP: 115/76 mmHg (07/12 0659) Pulse Rate: 115 (07/12 0659)  Labs:  Recent Labs  10/11/15 1536 10/12/15 0525 10/13/15 0459  HGB 14.7  --   --   HCT 43.7  --   --   PLT 141*  --   --   LABPROT  --  21.0* 20.8*  INR  --  1.82* 1.87*  CREATININE 0.86  --   --     CrCl cannot be calculated (Unknown ideal weight.).   Medical History: Past Medical History  Diagnosis Date  . Dementia   . DVT (deep venous thrombosis) ([place])   . 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 Patient went to have INR checked and became very combative at PCP office, so was sent to the ED for evaluation. Patient's PTA dose reported per daughter as 5mg  on Monday, Wednesday, and Friday and 2.5mg  on all other days of the week. However, per documentation in med rec, daughter states that often she goes by her "feeling" when dosing pt's warfarin, despite dosing instructions given by provider. Note last PTA dose of warfarin given on 10/10/15 at 2230. Pharmacy consulted today  to dose warfarin therapy while patient in the hospital.  Today, 10/13/2015:  INR 1.87-subtherapeutic, but increasing   CBC (7/10): H/H WNL, Pltc slightly low at 141K (c/w previous values)  No bleeding issues reported per nursing  Regular diet ordered  Drug-drug interactions: Divalproex added 7/11-may enhance anticoagulant activity and transiently increase INR by displacing warfarin from albumin-binding sites   Goal of Therapy:  INR 2-3 Monitor platelets by anticoagulation protocol: Yes   Plan:  Warfarin 5mg  PO x 1 today as per home regimen  Daily PT/INR CBC at least q72h while patient remains in the hospital Monitor closely for s/s of bleeding   Lindell Spar, PharmD, BCPS Pager: [phone] 10/13/2015 7:54 AM

## 2015-12-21 ENCOUNTER — Emergency Department (HOSPITAL_COMMUNITY)
Admission: EM | Admit: 2015-12-21 | Discharge: 2015-12-21 | Disposition: A | Discharge status: Home / Self Care | Payer: Medicare HMO | Attending: Emergency Medicine | Admitting: Emergency Medicine

## 2015-12-21 ENCOUNTER — Encounter (HOSPITAL_COMMUNITY): Payer: Self-pay | Admitting: Emergency Medicine

## 2015-12-21 DIAGNOSIS — S0990XA Unspecified injury of head, initial encounter: Secondary | ICD-10-CM | POA: Diagnosis present

## 2015-12-21 DIAGNOSIS — X58XXXA Exposure to other specified factors, initial encounter: Secondary | ICD-10-CM | POA: Insufficient documentation

## 2015-12-21 DIAGNOSIS — Y92129 Unspecified place in nursing home as the place of occurrence of the external cause: Secondary | ICD-10-CM | POA: Insufficient documentation

## 2015-12-21 DIAGNOSIS — F0390 Unspecified dementia without behavioral disturbance: Secondary | ICD-10-CM | POA: Diagnosis not present

## 2015-12-21 DIAGNOSIS — Y999 Unspecified external cause status: Secondary | ICD-10-CM | POA: Insufficient documentation

## 2015-12-21 DIAGNOSIS — Z79899 Other long term (current) drug therapy: Secondary | ICD-10-CM | POA: Insufficient documentation

## 2015-12-21 DIAGNOSIS — Y939 Activity, unspecified: Secondary | ICD-10-CM | POA: Diagnosis not present

## 2015-12-21 MED ORDER — LORAZEPAM 2 MG/ML IJ SOLN
1.0000 mg | Freq: Once | INTRAMUSCULAR | Status: AC
  Administered 2015-12-21: 1 mg via INTRAMUSCULAR
  Filled 2015-12-21: qty 1

## 2015-12-21 NOTE — ED Triage Notes (Addendum)
Per EMS, patient has a small abrasion to left temporal region. No other bruising/laceration/trauma/injury noted elsewhere. Staff unsure if patient fell. Patient here for evaluation. Hx of dementia. Patient is from [facility].

## 2015-12-21 NOTE — ED Provider Notes (Signed)
[facility] DEPT Provider Note   CSN: [id number] Arrival date & time: 12/21/15  [id number]     History   Chief Complaint Chief Complaint  Patient presents with  . Abrasion   Level V caveat: Dementia  HPI Luke Montgomery is a 82 y.o. male.  Patient is brought to the emergency department from the nursing home where he resides with dementia.  His daughter is here to provide additional history.  Staff reportedly saw him walking around with small amount of blood coming from his left temporal region.  No fall was witnessed.  He was in the emergency department for evaluation.  Patient is without complaint at this time but he does have dementia and can provide limited history.  His daughter reports that he has baseline mental status at this time and his had no recent issues.  He is on an anticoagulation for history of DVT and pulmonary embolism.  No reports of vomiting.   The history is provided by the patient and a relative.      Past Medical History:  Diagnosis Date  . Dementia   . DVT (deep venous thrombosis) ([place])   . Pulmonary embolism [facility])     Patient Active Problem List   Diagnosis Date Noted  . Dementia with behavioral disturbance 10/12/2015    Past Surgical History:  Procedure Laterality Date  . HERNIA REPAIR    . SMALL INTESTINE SURGERY     secondary to removal of recurrent polyp       Home Medications    Prior to Admission medications   Medication Sig Start Date End Date Taking? Authorizing Provider  acetaminophen (TYLENOL) 500 MG tablet Take 500 mg by mouth every 4 (four) hours as needed for mild pain, moderate pain, fever or headache.   Yes Historical Provider, MD  alum & mag hydroxide-simeth (MINTOX) 200-200-20 MG/5ML suspension Take 30 mLs by mouth as needed for indigestion or heartburn.   Yes Historical Provider, MD  amantadine (SYMMETREL) 100 MG capsule Take 100 mg by mouth 2 (two) times daily.   Yes Historical Provider, MD  busPIRone (BUSPAR) 10 MG  tablet Take 10 mg by mouth 2 (two) times daily with a meal.   Yes Historical Provider, MD  carbamazepine (TEGRETOL) 200 MG tablet Take 200 mg by mouth 2 (two) times daily with a meal.   Yes Historical Provider, MD  cholecalciferol (VITAMIN D) 1000 units tablet Take 1,000 Units by mouth daily.   Yes Historical Provider, MD  citalopram (CELEXA) 20 MG tablet Take 20 mg by mouth daily.   Yes Historical Provider, MD  donepezil (ARICEPT) 10 MG tablet Take 10 mg by mouth at bedtime.   Yes Historical Provider, MD  guaifenesin (ROBAFEN) 100 MG/5ML syrup Take 200 mg by mouth every 6 (six) hours as needed for cough.   Yes Historical Provider, MD  loperamide (IMODIUM) 2 MG capsule Take 2 mg by mouth as needed for diarrhea or loose stools.   Yes Historical Provider, MD  LORazepam (ATIVAN) 0.5 MG tablet Take 0.5 mg by mouth every 8 (eight) hours as needed (for agitation).   Yes Historical Provider, MD  Melatonin 1 MG TABS Take 1 mg by mouth daily.   Yes Historical Provider, MD  memantine (NAMENDA XR) 7 MG CP24 24 hr capsule Take 7 mg by mouth daily.   Yes Historical Provider, MD  neomycin-bacitracin-polymyxin (NEOSPORIN) ointment Apply 1 application topically as needed for wound care.   Yes Historical Provider, MD  OLANZapine (ZYPREXA) 10 MG tablet  Take 10 mg by mouth daily with breakfast.   Yes Historical Provider, MD  risperiDONE (RISPERDAL) 0.5 MG tablet Take 0.5 mg by mouth every 4 (four) hours as needed (for psychosis/agitation).   Yes Historical Provider, MD  rivaroxaban (XARELTO) 20 MG TABS tablet Take 20 mg by mouth daily with supper.   Yes Historical Provider, MD  tamsulosin (FLOMAX) 0.4 MG CAPS capsule Take 0.4 mg by mouth at bedtime.   Yes Historical Provider, MD  traZODone (DESYREL) 50 MG tablet Take 50 mg by mouth at bedtime.   Yes Historical Provider, MD    Family History No family history on file.  Social History Social History  Substance Use Topics  . Smoking status: Never Smoker  .  Smokeless tobacco: Never Used  . Alcohol use No     Allergies   Dilaudid [hydromorphone hcl] and Penicillins   Review of Systems Review of Systems  Unable to perform ROS: Dementia     Physical Exam Updated Vital Signs BP 176/60 (BP Location: Left Arm)   Pulse 61   Temp 99 F (37.2 C) (Oral)   Resp 18   Ht 5\' 10"  (1.778 m)   Wt 160 lb (72.6 kg)   SpO2 95%   BMI 22.96 kg/m   Physical Exam  Constitutional: He appears well-developed and well-nourished.  HENT:  Head: Normocephalic.  Small abrasion left lateral wall region without active bleeding.  Extraocular movements are intact.  No trismus or malocclusion  Eyes: EOM are normal. Pupils are equal, round, and reactive to light.  Neck: Normal range of motion.  Cardiovascular: Normal rate, regular rhythm and intact distal pulses.   Pulmonary/Chest: Effort normal and breath sounds normal. No respiratory distress.  Abdominal: Soft. He exhibits no distension. There is no tenderness.  Musculoskeletal:  Full range of motion bilateral hips, knees, ankles.  Full range of motion bilateral shoulders, elbows, wrists.  Neurological: He is alert.  5/5 strength in major muscle groups of  bilateral upper and lower extremities. Speech normal. No facial asymetry.   Skin: Skin is warm and dry.  Psychiatric: He has a normal mood and affect.  Nursing note and vitals reviewed.    ED Treatments / Results  Labs (all labs ordered are listed, but only abnormal results are displayed) Labs Reviewed - No data to display  EKG  EKG Interpretation None       Radiology No results found.  Procedures Procedures (including critical care time)  Medications Ordered in ED Medications - No data to display   Initial Impression / Assessment and Plan / ED Course  I have reviewed the triage vital signs and the nursing notes.  Pertinent labs & imaging results that were available during my care of the patient were reviewed by me and considered  in my medical decision making (see chart for details).  Clinical Course    Patient is overall well-appearing.  No headache.  Vital signs are normal.  Minimal trauma to his head.  It's not even clear as to whether or not he fell.  There is no other signs of scalp hematoma or lacerations or abrasions.  Isolated abrasion to the left temporal and left lateral periorbital region.  No hematoma noted.  At a prolonged discussion with the patient's daughter reports that even if he did have a head bleed he would not be a candidate for operative repair.  No indication for CT imaging.  Family is on board with this.  Full range of motion of major joints.  Overall well-appearing.  Discharge back to the nursing facility at this time.  Final Clinical Impressions(s) / ED Diagnoses   Final diagnoses:  Minor head injury, initial encounter    New Prescriptions New Prescriptions   No medications on file     Jola Schmidt, MD 12/21/15 1023

## 2015-12-21 NOTE — ED Notes (Signed)
Bed: [id number] Expected date:  Expected time:  Means of arrival:  Comments: EMS- 82yo M, abrasion on head/evaluation

## 2015-12-21 NOTE — ED Notes (Signed)
[facility] put patient on stretcher and patient began screaming and swinging at staff members. MD at bedside.

## 2015-12-21 NOTE — ED Notes (Signed)
[facility] at bedside 

## 2015-12-21 NOTE — ED Notes (Signed)
Bed: [id number] Expected date:  Expected time:  Means of arrival:  Comments: 82 yo, Quad/ n/v

## 2015-12-21 NOTE — ED Notes (Signed)
[facility] notified of transportation need 

## 2015-12-21 NOTE — ED Notes (Signed)
Patient now being calm/cooperative with staff/PTAR. [facility] transporting patient to nursing facility

## 2015-12-21 NOTE — ED Notes (Signed)
Bed: [facility] Expected date:  Expected time:  Means of arrival:  Comments: 

## 2016-02-15 ENCOUNTER — Emergency Department (HOSPITAL_COMMUNITY): Payer: Medicare HMO

## 2016-02-15 ENCOUNTER — Emergency Department (HOSPITAL_COMMUNITY)
Admission: EM | Admit: 2016-02-15 | Discharge: 2016-02-15 | Disposition: A | Discharge status: Home / Self Care | Payer: Medicare HMO | Attending: Emergency Medicine | Admitting: Emergency Medicine

## 2016-02-15 ENCOUNTER — Encounter (HOSPITAL_COMMUNITY): Payer: Self-pay

## 2016-02-15 DIAGNOSIS — Y939 Activity, unspecified: Secondary | ICD-10-CM | POA: Diagnosis not present

## 2016-02-15 DIAGNOSIS — Z7901 Long term (current) use of anticoagulants: Secondary | ICD-10-CM | POA: Insufficient documentation

## 2016-02-15 DIAGNOSIS — S0001XA Abrasion of scalp, initial encounter: Secondary | ICD-10-CM | POA: Insufficient documentation

## 2016-02-15 DIAGNOSIS — Y92129 Unspecified place in nursing home as the place of occurrence of the external cause: Secondary | ICD-10-CM | POA: Diagnosis not present

## 2016-02-15 DIAGNOSIS — Y999 Unspecified external cause status: Secondary | ICD-10-CM | POA: Insufficient documentation

## 2016-02-15 DIAGNOSIS — W07XXXA Fall from chair, initial encounter: Secondary | ICD-10-CM | POA: Insufficient documentation

## 2016-02-15 DIAGNOSIS — S0990XA Unspecified injury of head, initial encounter: Secondary | ICD-10-CM | POA: Diagnosis present

## 2016-02-15 DIAGNOSIS — F0391 Unspecified dementia with behavioral disturbance: Secondary | ICD-10-CM | POA: Insufficient documentation

## 2016-02-15 DIAGNOSIS — W19XXXA Unspecified fall, initial encounter: Secondary | ICD-10-CM

## 2016-02-15 NOTE — ED Notes (Signed)
[facility] called to transport pt back to facility. Pt agreable to discharge but unable to sign signature pad.

## 2016-02-15 NOTE — ED Triage Notes (Signed)
Presents with ems from [facility]  Staff reports he fell asleep in his wheelchair and fell forward hitting his head on the floor, he does take blood thinners, alert and oriented at his baseline with dementia.  2cm laceration on his forehead, bleeding controlled and dressed with 2x2 gauze, patient also has an abrasion on his left ear.  Patient has neck supported by towel placed by ems.  Vs wnl.

## 2016-02-15 NOTE — ED Provider Notes (Signed)
[place] DEPT Provider Note   CSN: [id number] Arrival date & time: 02/15/16  1250     History   Chief Complaint Chief Complaint  Patient presents with  . Fall    HPI Luke Montgomery is a 82 y.o. male.  Patient with history of dementia presents to the emergency department with chief complaint of fall. He comes from [facility], where he apparently fell asleep in his wheelchair and fell out of the chair striking his head on the floor. He is anticoagulated with Xarelto.  Level V caveat applies secondary to dementia   The history is provided by the patient. No language interpreter was used.    Past Medical History:  Diagnosis Date  . Dementia   . DVT (deep venous thrombosis) ([place])   . Pulmonary embolism [facility])     Patient Active Problem List   Diagnosis Date Noted  . Dementia with behavioral disturbance 10/12/2015    Past Surgical History:  Procedure Laterality Date  . HERNIA REPAIR    . SMALL INTESTINE SURGERY     secondary to removal of recurrent polyp       Home Medications    Prior to Admission medications   Medication Sig Start Date End Date Taking? Authorizing Provider  acetaminophen (TYLENOL) 500 MG tablet Take 500 mg by mouth every 4 (four) hours as needed for mild pain, moderate pain, fever or headache.    Historical Provider, MD  alum & mag hydroxide-simeth (MINTOX) 200-200-20 MG/5ML suspension Take 30 mLs by mouth as needed for indigestion or heartburn.    Historical Provider, MD  amantadine (SYMMETREL) 100 MG capsule Take 100 mg by mouth 2 (two) times daily.    Historical Provider, MD  busPIRone (BUSPAR) 10 MG tablet Take 10 mg by mouth 2 (two) times daily with a meal.    Historical Provider, MD  carbamazepine (TEGRETOL) 200 MG tablet Take 200 mg by mouth 2 (two) times daily with a meal.    Historical Provider, MD  cholecalciferol (VITAMIN D) 1000 units tablet Take 1,000 Units by mouth daily.    Historical Provider, MD  citalopram  (CELEXA) 20 MG tablet Take 20 mg by mouth daily.    Historical Provider, MD  donepezil (ARICEPT) 10 MG tablet Take 10 mg by mouth at bedtime.    Historical Provider, MD  guaifenesin (ROBAFEN) 100 MG/5ML syrup Take 200 mg by mouth every 6 (six) hours as needed for cough.    Historical Provider, MD  loperamide (IMODIUM) 2 MG capsule Take 2 mg by mouth as needed for diarrhea or loose stools.    Historical Provider, MD  LORazepam (ATIVAN) 0.5 MG tablet Take 0.5 mg by mouth every 8 (eight) hours as needed (for agitation).    Historical Provider, MD  Melatonin 1 MG TABS Take 1 mg by mouth daily.    Historical Provider, MD  memantine (NAMENDA XR) 7 MG CP24 24 hr capsule Take 7 mg by mouth daily.    Historical Provider, MD  neomycin-bacitracin-polymyxin (NEOSPORIN) ointment Apply 1 application topically as needed for wound care.    Historical Provider, MD  OLANZapine (ZYPREXA) 10 MG tablet Take 10 mg by mouth daily with breakfast.    Historical Provider, MD  risperiDONE (RISPERDAL) 0.5 MG tablet Take 0.5 mg by mouth every 4 (four) hours as needed (for psychosis/agitation).    Historical Provider, MD  rivaroxaban (XARELTO) 20 MG TABS tablet Take 20 mg by mouth daily with supper.    Historical Provider, MD  tamsulosin (FLOMAX) 0.4 MG CAPS capsule Take 0.4 mg by mouth at bedtime.    Historical Provider, MD  traZODone (DESYREL) 50 MG tablet Take 50 mg by mouth at bedtime.    Historical Provider, MD    Family History History reviewed. No pertinent family history.  Social History Social History  Substance Use Topics  . Smoking status: Never Smoker  . Smokeless tobacco: Never Used  . Alcohol use No     Allergies   Dilaudid [hydromorphone hcl] and Penicillins   Review of Systems Review of Systems  Unable to perform ROS: Dementia     Physical Exam Updated Vital Signs BP (!) 112/48   Pulse 67   Temp 98 F (36.7 C) (Axillary)   Resp 18   Ht 5\' 9"  (1.753 m)   Wt 68 kg   SpO2 97%   BMI  22.15 kg/m   Physical Exam  Constitutional: He appears well-developed and well-nourished.  HENT:  Head: Normocephalic and atraumatic.  Mild abrasion to left scalp  Eyes: Conjunctivae and EOM are normal. Pupils are equal, round, and reactive to light. Right eye exhibits no discharge. Left eye exhibits no discharge. No scleral icterus.  Neck: Normal range of motion. Neck supple. No JVD present.  Cardiovascular: Normal rate, regular rhythm and normal heart sounds.  Exam reveals no gallop and no friction rub.   No murmur heard. Pulmonary/Chest: Effort normal and breath sounds normal. No respiratory distress. He has no wheezes. He has no rales. He exhibits no tenderness.  Abdominal: Soft. He exhibits no distension and no mass. There is no tenderness. There is no rebound and no guarding.  Musculoskeletal: Normal range of motion. He exhibits no edema or tenderness.  No bony abnormality or deformity  Neurological:  Arouses to voice, doesn't follow commands Baseline dementia  Skin: Skin is warm and dry.  Psychiatric: He has a normal mood and affect. His behavior is normal. Judgment and thought content normal.  Nursing note and vitals reviewed.    ED Treatments / Results  Labs (all labs ordered are listed, but only abnormal results are displayed) Labs Reviewed - No data to display  EKG  EKG Interpretation None       Radiology Ct Head Wo Contrast  Result Date: 02/15/2016 CLINICAL DATA:  [place] out of wheelchair and hit head today. EXAM: CT HEAD WITHOUT CONTRAST CT CERVICAL SPINE WITHOUT CONTRAST TECHNIQUE: Multidetector CT imaging of the head and cervical spine was performed following the standard protocol without intravenous contrast. Multiplanar CT image reconstructions of the cervical spine were also generated. COMPARISON:  10/11/2015 FINDINGS: CT HEAD FINDINGS Brain: Stable age related cerebral atrophy, ventriculomegaly and periventricular white matter disease. No extra-axial fluid  collections are identified. No CT findings for acute hemispheric infarction or intracranial hemorrhage. No mass lesions. The brainstem and cerebellum are normal. Vascular: Stable vascular calcifications. No definite aneurysm or hyperdense vessels. Skull: No skull fracture or bone lesions. Sinuses/Orbits: Clear paranasal sinuses and mastoid air cells. A few scattered mastoid effusions are noted. The globes are intact. Other: No scalp hematoma or radiopaque foreign body. CT CERVICAL SPINE FINDINGS Alignment: The overall alignment is normal. Age related degenerative cervical spondylosis with multilevel disc disease and facet disease. Skull base and vertebrae: No acute fracture. Soft tissues and spinal canal: No prevertebral fluid or swelling. No visible canal hematoma. Disc levels:  Multilevel disc disease and facet disease. Upper chest: No significant findings. Other: None IMPRESSION: 1. Stable age related cerebral atrophy, ventriculomegaly and periventricular white matter  disease. 2. No acute intracranial findings or skull fracture. 3. Degenerative cervical spondylosis with multilevel disc disease and facet disease but no acute cervical spine fracture. Electronically Signed   By: Marijo Sanes M.D.   On: 02/15/2016 15:24   Ct Cervical Spine Wo Contrast  Result Date: 02/15/2016 CLINICAL DATA:  [place] out of wheelchair and hit head today. EXAM: CT HEAD WITHOUT CONTRAST CT CERVICAL SPINE WITHOUT CONTRAST TECHNIQUE: Multidetector CT imaging of the head and cervical spine was performed following the standard protocol without intravenous contrast. Multiplanar CT image reconstructions of the cervical spine were also generated. COMPARISON:  10/11/2015 FINDINGS: CT HEAD FINDINGS Brain: Stable age related cerebral atrophy, ventriculomegaly and periventricular white matter disease. No extra-axial fluid collections are identified. No CT findings for acute hemispheric infarction or intracranial hemorrhage. No mass lesions. The  brainstem and cerebellum are normal. Vascular: Stable vascular calcifications. No definite aneurysm or hyperdense vessels. Skull: No skull fracture or bone lesions. Sinuses/Orbits: Clear paranasal sinuses and mastoid air cells. A few scattered mastoid effusions are noted. The globes are intact. Other: No scalp hematoma or radiopaque foreign body. CT CERVICAL SPINE FINDINGS Alignment: The overall alignment is normal. Age related degenerative cervical spondylosis with multilevel disc disease and facet disease. Skull base and vertebrae: No acute fracture. Soft tissues and spinal canal: No prevertebral fluid or swelling. No visible canal hematoma. Disc levels:  Multilevel disc disease and facet disease. Upper chest: No significant findings. Other: None IMPRESSION: 1. Stable age related cerebral atrophy, ventriculomegaly and periventricular white matter disease. 2. No acute intracranial findings or skull fracture. 3. Degenerative cervical spondylosis with multilevel disc disease and facet disease but no acute cervical spine fracture. Electronically Signed   By: Marijo Sanes M.D.   On: 02/15/2016 15:24    Procedures Procedures (including critical care time)  Medications Ordered in ED Medications - No data to display   Initial Impression / Assessment and Plan / ED Course  I have reviewed the triage vital signs and the nursing notes.  Pertinent labs & imaging results that were available during my care of the patient were reviewed by me and considered in my medical decision making (see chart for details).  Clinical Course     Patient with fall from wheelchair. History of dementia. At reported baseline. Will check imaging. Suspect discharge to home.  CTs are negative for acute findings.  Patient seen by and discussed with Dr. Lita Mains, who agrees that the patient can be discharged to home.  Final Clinical Impressions(s) / ED Diagnoses   Final diagnoses:  Fall, initial encounter    New  Prescriptions New Prescriptions   No medications on file     Tejas Hariri, PA-C 02/15/16 1529    Julianne Rice, MD 02/16/16 1153

## 2016-02-15 NOTE — ED Notes (Signed)
[facility] contacted to tx patient back to [facility]

## 2016-04-14 ENCOUNTER — Observation Stay (HOSPITAL_COMMUNITY)
Admission: EM | Admit: 2016-04-14 | Discharge: 2016-04-16 | Disposition: A | Discharge status: Home / Self Care | Payer: Medicare PPO | Attending: Internal Medicine | Admitting: Internal Medicine

## 2016-04-14 ENCOUNTER — Encounter (HOSPITAL_COMMUNITY): Payer: Self-pay | Admitting: Emergency Medicine

## 2016-04-14 ENCOUNTER — Emergency Department (HOSPITAL_COMMUNITY): Payer: Medicare PPO

## 2016-04-14 DIAGNOSIS — Z86711 Personal history of pulmonary embolism: Secondary | ICD-10-CM | POA: Diagnosis not present

## 2016-04-14 DIAGNOSIS — Z86010 Personal history of colonic polyps: Secondary | ICD-10-CM | POA: Diagnosis not present

## 2016-04-14 DIAGNOSIS — X58XXXS Exposure to other specified factors, sequela: Secondary | ICD-10-CM | POA: Diagnosis not present

## 2016-04-14 DIAGNOSIS — Z888 Allergy status to other drugs, medicaments and biological substances status: Secondary | ICD-10-CM | POA: Diagnosis not present

## 2016-04-14 DIAGNOSIS — Z79899 Other long term (current) drug therapy: Secondary | ICD-10-CM | POA: Insufficient documentation

## 2016-04-14 DIAGNOSIS — Z7901 Long term (current) use of anticoagulants: Secondary | ICD-10-CM | POA: Insufficient documentation

## 2016-04-14 DIAGNOSIS — Z86718 Personal history of other venous thrombosis and embolism: Secondary | ICD-10-CM | POA: Diagnosis not present

## 2016-04-14 DIAGNOSIS — S2241XA Multiple fractures of ribs, right side, initial encounter for closed fracture: Secondary | ICD-10-CM | POA: Insufficient documentation

## 2016-04-14 DIAGNOSIS — Z8673 Personal history of transient ischemic attack (TIA), and cerebral infarction without residual deficits: Secondary | ICD-10-CM | POA: Diagnosis not present

## 2016-04-14 DIAGNOSIS — F0391 Unspecified dementia with behavioral disturbance: Secondary | ICD-10-CM | POA: Diagnosis not present

## 2016-04-14 DIAGNOSIS — R55 Syncope and collapse: Principal | ICD-10-CM | POA: Diagnosis present

## 2016-04-14 DIAGNOSIS — Z880 Allergy status to penicillin: Secondary | ICD-10-CM | POA: Insufficient documentation

## 2016-04-14 DIAGNOSIS — G9389 Other specified disorders of brain: Secondary | ICD-10-CM | POA: Diagnosis not present

## 2016-04-14 DIAGNOSIS — I083 Combined rheumatic disorders of mitral, aortic and tricuspid valves: Secondary | ICD-10-CM | POA: Diagnosis not present

## 2016-04-14 LAB — URINALYSIS, ROUTINE W REFLEX MICROSCOPIC
BILIRUBIN URINE: NEGATIVE
GLUCOSE, UA: NEGATIVE mg/dL
HGB URINE DIPSTICK: NEGATIVE
Ketones, ur: NEGATIVE mg/dL
Leukocytes, UA: NEGATIVE
Nitrite: NEGATIVE
Protein, ur: NEGATIVE mg/dL
SPECIFIC GRAVITY, URINE: 1.018 (ref 1.005–1.030)
pH: 6 (ref 5.0–8.0)

## 2016-04-14 LAB — BASIC METABOLIC PANEL
ANION GAP: 9 (ref 5–15)
BUN: 12 mg/dL (ref 6–20)
CALCIUM: 8.6 mg/dL — AB (ref 8.9–10.3)
CO2: 27 mmol/L (ref 22–32)
CREATININE: 0.75 mg/dL (ref 0.61–1.24)
Chloride: 106 mmol/L (ref 101–111)
GLUCOSE: 143 mg/dL — AB (ref 65–99)
Potassium: 3.8 mmol/L (ref 3.5–5.1)
Sodium: 142 mmol/L (ref 135–145)

## 2016-04-14 LAB — CBC WITH DIFFERENTIAL/PLATELET
BASOS ABS: 0 10*3/uL (ref 0.0–0.1)
BASOS PCT: 1 %
EOS PCT: 2 %
Eosinophils Absolute: 0.1 10*3/uL (ref 0.0–0.7)
HCT: 40.7 % (ref 39.0–52.0)
Hemoglobin: 13.4 g/dL (ref 13.0–17.0)
Lymphocytes Relative: 12 %
Lymphs Abs: 0.5 10*3/uL — ABNORMAL LOW (ref 0.7–4.0)
MCH: 29.6 pg (ref 26.0–34.0)
MCHC: 32.9 g/dL (ref 30.0–36.0)
MCV: 90 fL (ref 78.0–100.0)
MONO ABS: 0.3 10*3/uL (ref 0.1–1.0)
Monocytes Relative: 8 %
Neutro Abs: 3.5 10*3/uL (ref 1.7–7.7)
Neutrophils Relative %: 77 %
PLATELETS: 131 10*3/uL — AB (ref 150–400)
RBC: 4.52 MIL/uL (ref 4.22–5.81)
RDW: 13.5 % (ref 11.5–15.5)
WBC: 4.4 10*3/uL (ref 4.0–10.5)

## 2016-04-14 LAB — I-STAT TROPONIN, ED: Troponin i, poc: 0 ng/mL (ref 0.00–0.08)

## 2016-04-14 NOTE — ED Notes (Signed)
Taken to Radiology dept at this time.

## 2016-04-14 NOTE — ED Provider Notes (Signed)
[place] DEPT Provider Note   CSN: [id number] Arrival date & time: 04/14/16  2011     History   Chief Complaint Chief Complaint  Patient presents with  . Loss of Consciousness    HPI Luke Montgomery is a 83 y.o. male.  The history is provided by medical records, the EMS personnel, the nursing home and a relative.   Level V caveat: Dementia 83 year old male with history of dementia, DVT and PE on Xarelto, behavioral issues, presenting to the ED for syncope.  Patient is a [facility] at [facility].   Majority of history provided by daughter, Caren Griffins, who was with him.  States he ate his dinner and was eating an ice cream sandwich when he grabbed his head.  States Occasionally he will grab his head when eating ice cream to indicate a "brain freeze" but this time he slumped down in the chair, went unresponsive, and started to droop. She reports he was unresponsive for anywhere from 5-7 minutes. There were no jerking motions. No fall to the floor or head trauma. EMS was called and patient was initially hypotensive with systolic in the [id number], but this improved en route with fluids. Daughter denies any history of syncope. No cardiac history other than PE. No history of stroke.  Past Medical History:  Diagnosis Date  . Dementia   . DVT (deep venous thrombosis) ([place])   . Pulmonary embolism [facility])     Patient Active Problem List   Diagnosis Date Noted  . Dementia with behavioral disturbance 10/12/2015    Past Surgical History:  Procedure Laterality Date  . HERNIA REPAIR    . SMALL INTESTINE SURGERY     secondary to removal of recurrent polyp       Home Medications    Prior to Admission medications   Medication Sig Start Date End Date Taking? Authorizing Provider  acetaminophen (TYLENOL) 500 MG tablet Take 500 mg by mouth every 4 (four) hours as needed for mild pain, moderate pain, fever or headache.    Historical Provider, MD  alum & mag hydroxide-simeth (MINTOX)  200-200-20 MG/5ML suspension Take 30 mLs by mouth as needed for indigestion or heartburn.    Historical Provider, MD  amantadine (SYMMETREL) 100 MG capsule Take 100 mg by mouth 2 (two) times daily.    Historical Provider, MD  busPIRone (BUSPAR) 10 MG tablet Take 10 mg by mouth 2 (two) times daily with a meal.    Historical Provider, MD  carbamazepine (TEGRETOL) 200 MG tablet Take 200 mg by mouth 2 (two) times daily with a meal.    Historical Provider, MD  cholecalciferol (VITAMIN D) 1000 units tablet Take 1,000 Units by mouth daily.    Historical Provider, MD  citalopram (CELEXA) 20 MG tablet Take 20 mg by mouth daily.    Historical Provider, MD  donepezil (ARICEPT) 10 MG tablet Take 10 mg by mouth at bedtime.    Historical Provider, MD  guaifenesin (ROBAFEN) 100 MG/5ML syrup Take 200 mg by mouth every 6 (six) hours as needed for cough.    Historical Provider, MD  loperamide (IMODIUM) 2 MG capsule Take 2 mg by mouth as needed for diarrhea or loose stools.    Historical Provider, MD  LORazepam (ATIVAN) 0.5 MG tablet Take 0.5 mg by mouth every 8 (eight) hours as needed (for agitation).    Historical Provider, MD  Melatonin 1 MG TABS Take 1 mg by mouth daily.    Historical Provider, MD  memantine (NAMENDA XR) 7  MG CP24 24 hr capsule Take 7 mg by mouth daily.    Historical Provider, MD  neomycin-bacitracin-polymyxin (NEOSPORIN) ointment Apply 1 application topically as needed for wound care.    Historical Provider, MD  OLANZapine (ZYPREXA) 10 MG tablet Take 10 mg by mouth daily with breakfast.    Historical Provider, MD  risperiDONE (RISPERDAL) 0.5 MG tablet Take 0.5 mg by mouth every 4 (four) hours as needed (for psychosis/agitation).    Historical Provider, MD  rivaroxaban (XARELTO) 20 MG TABS tablet Take 20 mg by mouth daily with supper.    Historical Provider, MD  tamsulosin (FLOMAX) 0.4 MG CAPS capsule Take 0.4 mg by mouth at bedtime.    Historical Provider, MD  traZODone (DESYREL) 50 MG tablet  Take 50 mg by mouth at bedtime.    Historical Provider, MD    Family History No family history on file.  Social History Social History  Substance Use Topics  . Smoking status: Never Smoker  . Smokeless tobacco: Never Used  . Alcohol use No     Allergies   Dilaudid [hydromorphone hcl] and Penicillins   Review of Systems Review of Systems  Unable to perform ROS: Dementia     Physical Exam Updated Vital Signs BP 150/71   Pulse (!) 59   Temp 97.6 F (36.4 C) (Oral)   Resp 12   Ht 5\' 7"  (1.702 m)   Wt 68 kg   SpO2 96%   BMI 23.49 kg/m   Physical Exam  Constitutional: He appears well-developed and well-nourished.  HENT:  Head: Normocephalic and atraumatic.  Right Ear: Tympanic membrane and ear canal normal.  Left Ear: Tympanic membrane and ear canal normal.  Nose: Rhinorrhea present.  Mouth/Throat: Uvula is midline, oropharynx is clear and moist and mucous membranes are normal.  No dentition  Eyes: Conjunctivae and EOM are normal. Pupils are equal, round, and reactive to light.  Pupils pinpoint bilaterally  Neck: Normal range of motion.  Cardiovascular: Normal rate, regular rhythm and normal heart sounds.   Pulmonary/Chest: Effort normal and breath sounds normal.  Abdominal: Soft. Bowel sounds are normal.  Musculoskeletal: Normal range of motion.  Neurological: He is alert.  Awake, alert, answers yes or no to questions but does not provide any further verbal responses, able to move all 4 extremities when prompted, seems to follow and understand commands  Skin: Skin is warm and dry.  Psychiatric: He has a normal mood and affect.  Nursing note and vitals reviewed.    ED Treatments / Results  Labs (all labs ordered are listed, but only abnormal results are displayed) Labs Reviewed  CBC WITH DIFFERENTIAL/PLATELET - Abnormal; Notable for the following:       Result Value   Platelets 131 (*)    Lymphs Abs 0.5 (*)    All other components within normal limits    BASIC METABOLIC PANEL - Abnormal; Notable for the following:    Glucose, Bld 143 (*)    Calcium 8.6 (*)    All other components within normal limits  URINALYSIS, ROUTINE W REFLEX MICROSCOPIC  I-STAT TROPOININ, ED    EKG  EKG Interpretation None       Radiology Dg Chest 2 View  Result Date: 04/14/2016 CLINICAL DATA:  Syncope and headache on Xarelto. Passed out for 5-7 minutes tonight. History of dementia. EXAM: CHEST  2 VIEW COMPARISON:  10/11/2015 FINDINGS: Shallow inspiration with linear atelectasis in the lung bases. Cardiac enlargement without vascular congestion. No focal consolidation or airspace disease.  No blunting of costophrenic angles. No pneumothorax. Calcified and tortuous aorta. Degenerative changes in the spine and shoulders. Old right rib fractures. IMPRESSION: Shallow inspiration with linear atelectasis in the lung bases. Cardiac enlargement. No focal consolidation. Electronically Signed   By: Lucienne Capers M.D.   On: 04/14/2016 21:29   Ct Head Wo Contrast  Result Date: 04/14/2016 CLINICAL DATA:  Syncopal episode for 5-7 minutes. Headache before the incident. On blood thinners. History of dementia. EXAM: CT HEAD WITHOUT CONTRAST TECHNIQUE: Contiguous axial images were obtained from the base of the skull through the vertex without intravenous contrast. COMPARISON:  02/15/2016 FINDINGS: Brain: Diffuse cerebral atrophy. Ventricular dilatation consistent with central atrophy. Low-attenuation changes in the deep white matter consistent with small vessel ischemia. Patchy areas of mild encephalomalacia in the right anterior frontal lobe consistent with old infarct. Unchanged since prior study. No abnormal extra-axial fluid collections. No mass effect or midline shift. Gray-white matter junctions are distinct. Basal cisterns are not effaced. No acute intracranial hemorrhage. Vascular: Vascular calcifications. Skull: Normal. Negative for fracture or focal lesion. Sinuses/Orbits:  Mucosal thickening throughout the paranasal sinuses with opacification of some of the ethmoid air cells and left maxillary antrum. Small left mastoid effusions. Sinus disease has progressed since the previous study. Other: None. IMPRESSION: No acute intracranial abnormalities. Chronic atrophy and small vessel ischemic changes. Old right frontal infarct. Progressing inflammatory changes in the paranasal sinuses. Partial left mastoid effusion. Electronically Signed   By: Lucienne Capers M.D.   On: 04/14/2016 22:04    Procedures Procedures (including critical care time)  Medications Ordered in ED Medications - No data to display   Initial Impression / Assessment and Plan / ED Course  I have reviewed the triage vital signs and the nursing notes.  Pertinent labs & imaging results that were available during my care of the patient were reviewed by me and considered in my medical decision making (see chart for details).  Clinical Course    83 year old male here with syncope. This was witnessed by his daughter who provides additional history. There was no head injury. Was initially hypotensive, improved with fluids.  Patient is awake and oriented to his baseline on arrival. He does have dementia and verbal responses are limited to "yes" or "no".  He does follow commands when prompted and seems to understand the situation.  Workup including labs, EKG, CT head, and chest x-ray negative for acute findings. Given patient's advanced age and new syncope, will admit for observation and further workup.  Final Clinical Impressions(s) / ED Diagnoses   Final diagnoses:  Syncope, unspecified syncope type    New Prescriptions New Prescriptions   No medications on file     Larene Pickett, Hershal Coria 04/15/16 0033    Virgel Manifold, MD 04/24/16 [phone]

## 2016-04-14 NOTE — ED Triage Notes (Signed)
Brought via ems from [employer].  Family was with patient eating ice cream when patient c/o headache and stated I'm going to die.  Family reported to ems that patient passed out for 5-7 minutes.  Appears to be at baseline now.  Hx of alzheimers/dementia.  Says yes and no.  Little other verbal response.  Given 500cc NS enroute.  CBG-135.

## 2016-04-15 ENCOUNTER — Observation Stay (HOSPITAL_BASED_OUTPATIENT_CLINIC_OR_DEPARTMENT_OTHER): Payer: Medicare PPO

## 2016-04-15 ENCOUNTER — Encounter (HOSPITAL_COMMUNITY): Payer: Self-pay | Admitting: Internal Medicine

## 2016-04-15 ENCOUNTER — Observation Stay (HOSPITAL_COMMUNITY): Payer: Medicare PPO

## 2016-04-15 ENCOUNTER — Other Ambulatory Visit (HOSPITAL_COMMUNITY): Payer: Self-pay

## 2016-04-15 DIAGNOSIS — R55 Syncope and collapse: Secondary | ICD-10-CM | POA: Diagnosis present

## 2016-04-15 LAB — ECHOCARDIOGRAM COMPLETE
Height: 67 in
Weight: 2449.6 oz

## 2016-04-15 LAB — GLUCOSE, CAPILLARY: GLUCOSE-CAPILLARY: 105 mg/dL — AB (ref 65–99)

## 2016-04-15 MED ORDER — BUSPIRONE HCL 10 MG PO TABS
10.0000 mg | ORAL_TABLET | Freq: Two times a day (BID) | ORAL | Status: DC
  Administered 2016-04-15 – 2016-04-16 (×2): 10 mg via ORAL
  Filled 2016-04-15 (×4): qty 1

## 2016-04-15 MED ORDER — DONEPEZIL HCL 10 MG PO TABS
10.0000 mg | ORAL_TABLET | Freq: Every day | ORAL | Status: DC
  Administered 2016-04-15: 10 mg via ORAL
  Filled 2016-04-15: qty 1

## 2016-04-15 MED ORDER — AMANTADINE HCL 100 MG PO CAPS
100.0000 mg | ORAL_CAPSULE | Freq: Two times a day (BID) | ORAL | Status: DC
  Administered 2016-04-15 – 2016-04-16 (×3): 100 mg via ORAL
  Filled 2016-04-15 (×3): qty 1

## 2016-04-15 MED ORDER — LOPERAMIDE HCL 2 MG PO CAPS: 2.0000 mg | ORAL_CAPSULE | ORAL | Status: DC | PRN

## 2016-04-15 MED ORDER — CITALOPRAM HYDROBROMIDE 20 MG PO TABS
20.0000 mg | ORAL_TABLET | Freq: Every day | ORAL | Status: DC
  Administered 2016-04-15 – 2016-04-16 (×2): 20 mg via ORAL
  Filled 2016-04-15 (×2): qty 1

## 2016-04-15 MED ORDER — RIVAROXABAN 20 MG PO TABS
20.0000 mg | ORAL_TABLET | Freq: Every day | ORAL | Status: DC
  Administered 2016-04-15: 20 mg via ORAL
  Filled 2016-04-15: qty 1

## 2016-04-15 MED ORDER — DIVALPROEX SODIUM 125 MG PO CSDR
125.0000 mg | DELAYED_RELEASE_CAPSULE | Freq: Two times a day (BID) | ORAL | Status: DC
  Administered 2016-04-15 (×2): 125 mg via ORAL
  Filled 2016-04-15 (×4): qty 1

## 2016-04-15 MED ORDER — TRAZODONE HCL 50 MG PO TABS
50.0000 mg | ORAL_TABLET | Freq: Every day | ORAL | Status: DC
  Administered 2016-04-15: 50 mg via ORAL
  Filled 2016-04-15: qty 1

## 2016-04-15 MED ORDER — TAMSULOSIN HCL 0.4 MG PO CAPS
0.4000 mg | ORAL_CAPSULE | Freq: Every day | ORAL | Status: DC
  Administered 2016-04-15: 0.4 mg via ORAL
  Filled 2016-04-15: qty 1

## 2016-04-15 MED ORDER — OLANZAPINE 10 MG PO TABS
10.0000 mg | ORAL_TABLET | Freq: Every day | ORAL | Status: DC
  Administered 2016-04-15 – 2016-04-16 (×2): 10 mg via ORAL
  Filled 2016-04-15 (×2): qty 1

## 2016-04-15 MED ORDER — ACETAMINOPHEN 500 MG PO TABS: 500.0000 mg | ORAL_TABLET | ORAL | Status: DC | PRN

## 2016-04-15 MED ORDER — MEMANTINE HCL ER 14 MG PO CP24
14.0000 mg | ORAL_CAPSULE | Freq: Every day | ORAL | Status: DC
  Administered 2016-04-15 – 2016-04-16 (×2): 14 mg via ORAL
  Filled 2016-04-15 (×2): qty 1

## 2016-04-15 MED ORDER — GUAIFENESIN 100 MG/5ML PO SOLN: 200.0000 mg | Freq: Four times a day (QID) | ORAL | Status: DC | PRN

## 2016-04-15 MED ORDER — TAMSULOSIN HCL 0.4 MG PO CAPS: 0.4000 mg | ORAL_CAPSULE | Freq: Every day | ORAL | Status: DC

## 2016-04-15 MED ORDER — VITAMIN D 1000 UNITS PO TABS
1000.0000 [IU] | ORAL_TABLET | Freq: Every day | ORAL | Status: DC
  Administered 2016-04-15 – 2016-04-16 (×2): 1000 [IU] via ORAL
  Filled 2016-04-15 (×2): qty 1

## 2016-04-15 MED ORDER — ALUM & MAG HYDROXIDE-SIMETH 200-200-20 MG/5ML PO SUSP: 30.0000 mL | ORAL | Status: DC | PRN

## 2016-04-15 MED ORDER — CARBAMAZEPINE 200 MG PO TABS
200.0000 mg | ORAL_TABLET | Freq: Two times a day (BID) | ORAL | Status: DC
  Administered 2016-04-15 – 2016-04-16 (×3): 200 mg via ORAL
  Filled 2016-04-15 (×3): qty 1

## 2016-04-15 MED ORDER — TRAZODONE HCL 50 MG PO TABS: 50.0000 mg | ORAL_TABLET | Freq: Every day | ORAL | Status: DC

## 2016-04-15 MED ORDER — RISPERIDONE 0.5 MG PO TABS
0.5000 mg | ORAL_TABLET | ORAL | Status: DC | PRN
  Filled 2016-04-15: qty 1

## 2016-04-15 MED ORDER — SODIUM CHLORIDE 0.9% FLUSH
3.0000 mL | Freq: Two times a day (BID) | INTRAVENOUS | Status: DC
  Administered 2016-04-15 (×3): 3 mL via INTRAVENOUS

## 2016-04-15 MED ORDER — LORAZEPAM 0.5 MG PO TABS: 0.5000 mg | ORAL_TABLET | Freq: Three times a day (TID) | ORAL | Status: DC | PRN

## 2016-04-15 NOTE — Progress Notes (Signed)
Echocardiogram 2D Echocardiogram has been performed.  Luke Montgomery 04/15/2016, 2:07 PM

## 2016-04-15 NOTE — Progress Notes (Signed)
[facility] hospitalists progress note  Patient with no complaints. Per nursing no issues overnight.  Physical exam: Heart regular rate and rhythm no murmurs rubs gallops, levels clear to auscultation bilaterally with no increased work of breathing, abdomen soft nontender bowel sounds normal, legs without swelling or edema  Assessment and plan Agree with assessment and plan of Dr. Fabio Neighbors which are reviewed We'll get an echo and is negative likely discharge this afternoon Suspect vasovagal syncope Try to contact family this morning using information provided call went to voicemail which was not set up  Elwin Mocha MD

## 2016-04-15 NOTE — Clinical Social Work Note (Signed)
CSW notified pt ready for DC. CSW contacted facility [facility], spoke to nurse who reports they were expecting pt to come back and it is ok to send pt. CSW updated nurse.  Nurse advised CSW pt will not DC today. CSW updated facility.  CSW will continue to follow.  Eion Timbrook B. Joline Maxcy Clinical Social Work Dept Weekend Social Worker [phone] 5:21 PM

## 2016-04-15 NOTE — H&P (Signed)
History and Physical    [facility] [id number] DOB: 1925-05-01 DOA: 04/14/2016   PCP: Horatio Pel, MD Chief Complaint:  Chief Complaint  Patient presents with  . Loss of Consciousness    HPI: [facility] is a 83 y.o. male with medical history significant of advanced dementia, PE on xarelto, behavioral issues for which they have him on numerous psychoactive meds.  Patient is a resident at [facility].   Majority of history provided by daughter, Caren Griffins, who was with him.  States he ate his dinner and was eating an ice cream sandwich when he grabbed his head.  States Occasionally he will grab his head when eating ice cream to indicate a "brain freeze" but this time he slumped down in the chair, went unresponsive, and started to droop. She reports he was unresponsive for anywhere from 5-7 minutes. There were no jerking motions. No fall to the floor or head trauma. EMS was called and patient was initially hypotensive with systolic in the 99991111, but this improved en route with fluids. Daughter denies any history of syncope. No cardiac history other than PE. No history of stroke.  ED Course: BP has remained stable and patient resting comfortably in the ED here.  Review of Systems: As per HPI otherwise 10 point review of systems negative.    Past Medical History:  Diagnosis Date  . Dementia   . DVT (deep venous thrombosis) ([place])   . Pulmonary embolism [facility])     Past Surgical History:  Procedure Laterality Date  . HERNIA REPAIR    . SMALL INTESTINE SURGERY     secondary to removal of recurrent polyp     reports that he has never smoked. He has never used smokeless tobacco. He reports that he does not drink alcohol or use drugs.  Allergies  Allergen Reactions  . Dilaudid [Hydromorphone Hcl] Other (See Comments)    Out of his mind. Extremely confused.   Marland Kitchen Penicillins Other (See Comments)    Has patient had a PCN reaction causing immediate rash,  facial/tongue/throat swelling, SOB or lightheadedness with hypotension: Yes Has patient had a PCN reaction causing severe rash involving mucus membranes or skin necrosis: Unknown Has patient had a PCN reaction that required hospitalization Unknown Has patient had a PCN reaction occurring within the last 10 years: Unknown above answers are "NO", then may proceed with Cephalosporin use.     Family History  Problem Relation Age of Onset  . Family history unknown: Yes   Patient unable to provide family history due to dementia.   Prior to Admission medications   Medication Sig Start Date End Date Taking? Authorizing Provider  acetaminophen (TYLENOL) 500 MG tablet Take 500 mg by mouth every 4 (four) hours as needed for mild pain, moderate pain, fever or headache.   Yes Historical Provider, MD  alum & mag hydroxide-simeth (MINTOX) 200-200-20 MG/5ML suspension Take 30 mLs by mouth as needed for indigestion or heartburn.   Yes Historical Provider, MD  amantadine (SYMMETREL) 100 MG capsule Take 100 mg by mouth 2 (two) times daily.   Yes Historical Provider, MD  busPIRone (BUSPAR) 10 MG tablet Take 10 mg by mouth 2 (two) times daily with a meal.   Yes Historical Provider, MD  carbamazepine (TEGRETOL) 200 MG tablet Take 200 mg by mouth 2 (two) times daily with a meal.   Yes Historical Provider, MD  cholecalciferol (VITAMIN D) 1000 units tablet Take 1,000 Units by mouth daily.   Yes Historical  Provider, MD  citalopram (CELEXA) 20 MG tablet Take 20 mg by mouth daily.   Yes Historical Provider, MD  divalproex (DEPAKOTE SPRINKLE) 125 MG capsule Take 125 mg by mouth 2 (two) times daily.   Yes Historical Provider, MD  donepezil (ARICEPT) 10 MG tablet Take 10 mg by mouth at bedtime.   Yes Historical Provider, MD  guaifenesin (ROBAFEN) 100 MG/5ML syrup Take 200 mg by mouth every 6 (six) hours as needed for cough.   Yes Historical Provider, MD  loperamide (IMODIUM) 2 MG capsule Take 2 mg by mouth as needed for  diarrhea or loose stools.   Yes Historical Provider, MD  LORazepam (ATIVAN) 0.5 MG tablet Take 0.5 mg by mouth every 8 (eight) hours as needed (for agitation).   Yes Historical Provider, MD  Melatonin 1 MG TABS Take 1 mg by mouth at bedtime.    Yes Historical Provider, MD  memantine (NAMENDA XR) 14 MG CP24 24 hr capsule Take 14 mg by mouth daily.   Yes Historical Provider, MD  neomycin-bacitracin-polymyxin (NEOSPORIN) ointment Apply 1 application topically as needed for wound care.   Yes Historical Provider, MD  OLANZapine (ZYPREXA) 10 MG tablet Take 10 mg by mouth daily with breakfast.   Yes Historical Provider, MD  risperiDONE (RISPERDAL) 0.5 MG tablet Take 0.5 mg by mouth every 4 (four) hours as needed (for psychosis/agitation).   Yes Historical Provider, MD  rivaroxaban (XARELTO) 20 MG TABS tablet Take 20 mg by mouth daily with supper.   Yes Historical Provider, MD  tamsulosin (FLOMAX) 0.4 MG CAPS capsule Take 0.4 mg by mouth at bedtime.   Yes Historical Provider, MD  traZODone (DESYREL) 50 MG tablet Take 50 mg by mouth at bedtime.   Yes Historical Provider, MD    Physical Exam: Vitals:   04/15/16 0040 04/15/16 0045 04/15/16 0100 04/15/16 0152  BP:  137/78 144/81 (!) 149/69  Pulse: 66 60 61 79  Resp: 16 13 12 20   Temp:   98.2 F (36.8 C) 97.7 F (36.5 C)  TempSrc:   Oral Oral  SpO2: 98% 92%  94%  Weight:      Height:          Constitutional: NAD, calm, comfortable Eyes: PERRL, lids and conjunctivae normal, pinpoint pupils bilaterally. ENMT: Mucous membranes are moist. Posterior pharynx clear of any exudate or lesions. No dentition. Neck: normal, supple, no masses, no thyromegaly Respiratory: clear to auscultation bilaterally, no wheezing, no crackles. Normal respiratory effort. No accessory muscle use.  Cardiovascular: Regular rate and rhythm, no murmurs / rubs / gallops. No extremity edema. 2+ pedal pulses. No carotid bruits.  Abdomen: no tenderness, no masses palpated. No  hepatosplenomegaly. Bowel sounds positive.  Musculoskeletal: no clubbing / cyanosis. No joint deformity upper and lower extremities. Good ROM, no contractures. Normal muscle tone.  Skin: no rashes, lesions, ulcers. No induration Neurologic: CN 2-12 grossly intact. Sensation intact, DTR normal. Strength 5/5 in all 4.  Psychiatric: Awake, alert, answers yes or no to questions, dosent provide further verbal response, MAE when prompted, seems to follow and understand commands.   Labs on Admission: I have personally reviewed following labs and imaging studies  CBC:  Recent Labs Lab 04/14/16 2055  WBC 4.4  NEUTROABS 3.5  HGB 13.4  HCT 40.7  MCV 90.0  PLT [id number]*   Basic Metabolic Panel:  Recent Labs Lab 04/14/16 2055  NA 142  K 3.8  CL 106  CO2 27  GLUCOSE 143*  BUN 12  CREATININE 0.75  CALCIUM 8.6*   GFR: Estimated Creatinine Clearance: 59.7 mL/min (by C-G formula based on SCr of 0.75 mg/dL). Liver Function Tests: No results for input(s): AST, ALT, ALKPHOS, BILITOT, PROT, ALBUMIN in the last 168 hours. No results for input(s): LIPASE, AMYLASE in the last 168 hours. No results for input(s): AMMONIA in the last 168 hours. Coagulation Profile: No results for input(s): INR, PROTIME in the last 168 hours. Cardiac Enzymes: No results for input(s): CKTOTAL, CKMB, CKMBINDEX, TROPONINI in the last 168 hours. BNP (last 3 results) No results for input(s): PROBNP in the last 8760 hours. HbA1C: No results for input(s): HGBA1C in the last 72 hours. CBG: No results for input(s): GLUCAP in the last 168 hours. Lipid Profile: No results for input(s): CHOL, HDL, LDLCALC, TRIG, CHOLHDL, LDLDIRECT in the last 72 hours. Thyroid Function Tests: No results for input(s): TSH, T4TOTAL, FREET4, T3FREE, THYROIDAB in the last 72 hours. Anemia Panel: No results for input(s): VITAMINB12, FOLATE, FERRITIN, TIBC, IRON, RETICCTPCT in the last 72 hours. Urine analysis:    Component Value Date/Time    COLORURINE YELLOW 04/14/2016 F[place] 04/14/2016 2240   LABSPEC 1.018 04/14/2016 2240   PHURINE 6.0 04/14/2016 2240   GLUCOSEU NEGATIVE 04/14/2016 2240   HGBUR NEGATIVE 04/14/2016 2240   BILIRUBINUR NEGATIVE 04/14/2016 2240   KETONESUR NEGATIVE 04/14/2016 2240   PROTEINUR NEGATIVE 04/14/2016 2240   NITRITE NEGATIVE 04/14/2016 2240   LEUKOCYTESUR NEGATIVE 04/14/2016 2240   Sepsis Labs: @LABRCNTIP (procalcitonin:4,lacticidven:4) )No results found for this or any previous visit (from the past 240 hour(s)).   Radiological Exams on Admission: Dg Chest 2 View  Result Date: 04/14/2016 CLINICAL DATA:  Syncope and headache on Xarelto. Passed out for 5-7 minutes tonight. History of dementia. EXAM: CHEST  2 VIEW COMPARISON:  10/11/2015 FINDINGS: Shallow inspiration with linear atelectasis in the lung bases. Cardiac enlargement without vascular congestion. No focal consolidation or airspace disease. No blunting of costophrenic angles. No pneumothorax. Calcified and tortuous aorta. Degenerative changes in the spine and shoulders. Old right rib fractures. IMPRESSION: Shallow inspiration with linear atelectasis in the lung bases. Cardiac enlargement. No focal consolidation. Electronically Signed   By: Lucienne Capers M.D.   On: 04/14/2016 21:29   Ct Head Wo Contrast  Result Date: 04/14/2016 CLINICAL DATA:  Syncopal episode for 5-7 minutes. Headache before the incident. On blood thinners. History of dementia. EXAM: CT HEAD WITHOUT CONTRAST TECHNIQUE: Contiguous axial images were obtained from the base of the skull through the vertex without intravenous contrast. COMPARISON:  02/15/2016 FINDINGS: Brain: Diffuse cerebral atrophy. Ventricular dilatation consistent with central atrophy. Low-attenuation changes in the deep white matter consistent with small vessel ischemia. Patchy areas of mild encephalomalacia in the right anterior frontal lobe consistent with old infarct. Unchanged since prior  study. No abnormal extra-axial fluid collections. No mass effect or midline shift. Gray-white matter junctions are distinct. Basal cisterns are not effaced. No acute intracranial hemorrhage. Vascular: Vascular calcifications. Skull: Normal. Negative for fracture or focal lesion. Sinuses/Orbits: Mucosal thickening throughout the paranasal sinuses with opacification of some of the ethmoid air cells and left maxillary antrum. Small left mastoid effusions. Sinus disease has progressed since the previous study. Other: None. IMPRESSION: No acute intracranial abnormalities. Chronic atrophy and small vessel ischemic changes. Old right frontal infarct. Progressing inflammatory changes in the paranasal sinuses. Partial left mastoid effusion. Electronically Signed   By: Lucienne Capers M.D.   On: 04/14/2016 22:04    EKG: Independently reviewed.  Assessment/Plan Active Problems:   Syncope  1. Syncope - 1. Syncope obs pathway 2. 2d echo 3. Tele monitor 2. Dementia - Im kinda suspicious that one (or several) of his massive number of psychiatric meds may have caused this, but his medication regimen is far to complex for me to even begin to try and adjust and feel comfortable so will leave him on these meds for the moment unless we see any changes during observation.   DVT prophylaxis: Xarelto Code Status: Full Family Communication: No family in room Consults called: None Admission status: Place in [place], [facility] Pager [phone] from 7PM-7AM  If 7AM-7PM, please contact the day physician for the patient [email or web address] Password [email or web address]  04/15/2016, 2:01 AM

## 2016-04-15 NOTE — Progress Notes (Signed)
Pt arrived to floor from ED.  CCMD notified of Pt arrival x2.  Attending notified of Pt arrival.  Pt denies pain at this time.  Will cont to monitor.

## 2016-04-16 DIAGNOSIS — R55 Syncope and collapse: Secondary | ICD-10-CM

## 2016-04-16 LAB — BASIC METABOLIC PANEL
ANION GAP: 6 (ref 5–15)
BUN: 12 mg/dL (ref 6–20)
CALCIUM: 8.8 mg/dL — AB (ref 8.9–10.3)
CO2: 28 mmol/L (ref 22–32)
Chloride: 106 mmol/L (ref 101–111)
Creatinine, Ser: 0.87 mg/dL (ref 0.61–1.24)
Glucose, Bld: 97 mg/dL (ref 65–99)
Potassium: 4 mmol/L (ref 3.5–5.1)
Sodium: 140 mmol/L (ref 135–145)

## 2016-04-16 NOTE — Discharge Summary (Signed)
Physician Discharge Summary  [name] [id number] DOB: 04-29-1925 DOA: 04/14/2016  PCP: Horatio Pel, MD  Admit date: 04/14/2016 Discharge date: 04/16/2016  Recommendations for Outpatient Follow-up:  1. Follow up with PCP in 1-2 weeks after discharge   Discharge Diagnoses:  Active Problems:   Syncope   Discharge Condition: stable   Diet recommendation: as tolerated   History of present illness:   Per HPI "83 y.o. male with medical history significant of advanced dementia, PE on xarelto, behavioral issues for which they have him on numerous psychoactive meds.  [facility] a [facility] at [facility]. Majority of history provided by daughter, [facility], who was with him. States he ate his dinner and was eating an ice cream sandwich when he grabbed his head. States Occasionally he will grab his head when eating ice cream to indicate a "brain freeze" but this time he slumped down in the chair, went unresponsive, and started to droop. She reports he was unresponsive for anywhere from 5-7 minutes. There were no jerking motions. No fall to the floor or head trauma. EMS was called and patient was initially hypotensive with systolic in the 99991111, but this improved en route with fluids. Daughter denies any history of syncope. No cardiac history other than PE. No history of stroke."  Hospital Course:   Active Problems:   Syncope - No acute findings on CT head - ECHO with normal EF - Stable for discharge at this time  - Resume home meds  Signed:  Leisa Lenz, MD  [facility] 04/16/2016, 12:53 PM  Pager #: [phone]  Time spent in minutes: less than 30 minutes  Procedures:  2 D ECHO - EF 55% and grade 1 DD  Consultations:  None   Discharge Exam: Vitals:   04/15/16 2021 04/16/16 0458  BP: (!) 170/68 124/62  Pulse: 61 70  Resp: 20 20  Temp: 98 F (36.7 C) 98.9 F (37.2 C)   Vitals:   04/15/16 0500 04/15/16 1438 04/15/16 2021 04/16/16 0458   BP: 117/74 (!) 159/58 (!) 170/68 124/62  Pulse: 83 63 61 70  Resp: 20 18 20 20   Temp: 97.5 F (36.4 C) 98.7 F (37.1 C) 98 F (36.7 C) 98.9 F (37.2 C)  TempSrc: Oral Oral Oral Oral  SpO2: 98% 96% 95% 95%  Weight: 69.4 kg (153 lb 1.6 oz)   68.9 kg (151 lb 14.4 oz)  Height:        General: Pt is alert, follows commands appropriately, not in acute distress Cardiovascular: Regular rate and rhythm, S1/S2 + Respiratory: Clear to auscultation bilaterally, no wheezing, no crackles, no rhonchi Abdominal: Soft, non tender, non distended, bowel sounds +, no guarding Extremities: no cyanosis, pulses palpable bilaterally DP and PT Neuro: Grossly nonfocal  Discharge Instructions  Discharge Instructions    Call MD for:  persistant nausea and vomiting    Complete by:  As directed    Call MD for:  redness, tenderness, or signs of infection (pain, swelling, redness, odor or green/yellow discharge around incision site)    Complete by:  As directed    Call MD for:  severe uncontrolled pain    Complete by:  As directed    Diet - low sodium heart healthy    Complete by:  As directed    Increase activity slowly    Complete by:  As directed      Allergies as of 04/16/2016      Reactions   Dilaudid [hydromorphone Hcl] Other (See  Comments)   Out of his mind. Extremely confused.    Penicillins Other (See Comments)   Has patient had a PCN reaction causing immediate rash, facial/tongue/throat swelling, SOB or lightheadedness with hypotension: Yes Has patient had a PCN reaction causing severe rash involving mucus membranes or skin necrosis: Unknown Has patient had a PCN reaction that required hospitalization Unknown Has patient had a PCN reaction occurring within the last 10 years: Unknown above answers are "NO", then may proceed with Cephalosporin use.      Medication List    TAKE these medications   acetaminophen 500 MG tablet Commonly known as:  TYLENOL Take 500 mg by mouth every 4 (four)  hours as needed for mild pain, moderate pain, fever or headache.   amantadine 100 MG capsule Commonly known as:  SYMMETREL Take 100 mg by mouth 2 (two) times daily.   busPIRone 10 MG tablet Commonly known as:  BUSPAR Take 10 mg by mouth 2 (two) times daily with a meal.   carbamazepine 200 MG tablet Commonly known as:  TEGRETOL Take 200 mg by mouth 2 (two) times daily with a meal.   cholecalciferol 1000 units tablet Commonly known as:  VITAMIN D Take 1,000 Units by mouth daily.   citalopram 20 MG tablet Commonly known as:  CELEXA Take 20 mg by mouth daily.   divalproex 125 MG capsule Commonly known as:  DEPAKOTE SPRINKLE Take 125 mg by mouth 2 (two) times daily.   donepezil 10 MG tablet Commonly known as:  ARICEPT Take 10 mg by mouth at bedtime.   loperamide 2 MG capsule Commonly known as:  IMODIUM Take 2 mg by mouth as needed for diarrhea or loose stools.   LORazepam 0.5 MG tablet Commonly known as:  ATIVAN Take 0.5 mg by mouth every 8 (eight) hours as needed (for agitation).   Melatonin 1 MG Tabs Take 1 mg by mouth at bedtime.   MINTOX 200-200-20 MG/5ML suspension Generic drug:  alum & mag hydroxide-simeth Take 30 mLs by mouth as needed for indigestion or heartburn.   NAMENDA XR 14 MG Cp24 24 hr capsule Generic drug:  memantine Take 14 mg by mouth daily.   neomycin-bacitracin-polymyxin ointment Commonly known as:  NEOSPORIN Apply 1 application topically as needed for wound care.   OLANZapine 10 MG tablet Commonly known as:  ZYPREXA Take 10 mg by mouth daily with breakfast.   risperiDONE 0.5 MG tablet Commonly known as:  RISPERDAL Take 0.5 mg by mouth every 4 (four) hours as needed (for psychosis/agitation).   rivaroxaban 20 MG Tabs tablet Commonly known as:  XARELTO Take 20 mg by mouth daily with supper.   ROBAFEN 100 MG/5ML syrup Generic drug:  guaifenesin Take 200 mg by mouth every 6 (six) hours as needed for cough.   tamsulosin 0.4 MG Caps  capsule Commonly known as:  FLOMAX Take 0.4 mg by mouth at bedtime.   traZODone 50 MG tablet Commonly known as:  DESYREL Take 50 mg by mouth at bedtime.      Follow-up Information    Horatio Pel, MD. Schedule an appointment as soon as possible for a visit in 1 week(s).   Specialty:  Internal Medicine Contact information: [address] [place] [address] [phone]            The results of significant diagnostics from this hospitalization (including imaging, microbiology, ancillary and laboratory) are listed below for reference.    Significant Diagnostic Studies: Dg Chest 2 View  Result Date:  04/14/2016 CLINICAL DATA:  Syncope and headache on Xarelto. Passed out for 5-7 minutes tonight. History of dementia. EXAM: CHEST  2 VIEW COMPARISON:  10/11/2015 FINDINGS: Shallow inspiration with linear atelectasis in the lung bases. Cardiac enlargement without vascular congestion. No focal consolidation or airspace disease. No blunting of costophrenic angles. No pneumothorax. Calcified and tortuous aorta. Degenerative changes in the spine and shoulders. Old right rib fractures. IMPRESSION: Shallow inspiration with linear atelectasis in the lung bases. Cardiac enlargement. No focal consolidation. Electronically Signed   By: Lucienne Capers M.D.   On: 04/14/2016 21:29   Ct Head Wo Contrast  Result Date: 04/14/2016 CLINICAL DATA:  Syncopal episode for 5-7 minutes. Headache before the incident. On blood thinners. History of dementia. EXAM: CT HEAD WITHOUT CONTRAST TECHNIQUE: Contiguous axial images were obtained from the base of the skull through the vertex without intravenous contrast. COMPARISON:  02/15/2016 FINDINGS: Brain: Diffuse cerebral atrophy. Ventricular dilatation consistent with central atrophy. Low-attenuation changes in the deep white matter consistent with small vessel ischemia. Patchy areas of mild encephalomalacia in the right anterior frontal lobe  consistent with old infarct. Unchanged since prior study. No abnormal extra-axial fluid collections. No mass effect or midline shift. Gray-white matter junctions are distinct. Basal cisterns are not effaced. No acute intracranial hemorrhage. Vascular: Vascular calcifications. Skull: Normal. Negative for fracture or focal lesion. Sinuses/Orbits: Mucosal thickening throughout the paranasal sinuses with opacification of some of the ethmoid air cells and left maxillary antrum. Small left mastoid effusions. Sinus disease has progressed since the previous study. Other: None. IMPRESSION: No acute intracranial abnormalities. Chronic atrophy and small vessel ischemic changes. Old right frontal infarct. Progressing inflammatory changes in the paranasal sinuses. Partial left mastoid effusion. Electronically Signed   By: Lucienne Capers M.D.   On: 04/14/2016 22:04    Microbiology: No results found for this or any previous visit (from the past 240 hour(s)).   Labs: Basic Metabolic Panel:  Recent Labs Lab 04/14/16 2055 04/16/16 0301  NA 142 140  K 3.8 4.0  CL 106 106  CO2 27 28  GLUCOSE 143* 97  BUN 12 12  CREATININE 0.75 0.87  CALCIUM 8.6* 8.8*   Liver Function Tests: No results for input(s): AST, ALT, ALKPHOS, BILITOT, PROT, ALBUMIN in the last 168 hours. No results for input(s): LIPASE, AMYLASE in the last 168 hours. No results for input(s): AMMONIA in the last 168 hours. CBC:  Recent Labs Lab 04/14/16 2055  WBC 4.4  NEUTROABS 3.5  HGB 13.4  HCT 40.7  MCV 90.0  PLT 131*   Cardiac Enzymes: No results for input(s): CKTOTAL, CKMB, CKMBINDEX, TROPONINI in the last 168 hours. BNP: BNP (last 3 results) No results for input(s): BNP in the last 8760 hours.  ProBNP (last 3 results) No results for input(s): PROBNP in the last 8760 hours.  CBG:  Recent Labs Lab 04/15/16 0615  GLUCAP 105*

## 2016-04-16 NOTE — Progress Notes (Signed)
Pt. Discharged to [facility] place report called.

## 2016-04-16 NOTE — Clinical Social Work Note (Signed)
Clinical Social Worker notified patient is ready to DC, CSW facilitated patient discharge including contacting patient family and facility to confirm patient discharge plans and bed availability. Clinical information faxed to facility and pt/family agreeable with plan. CSW arranged ambulance transport via [facility] to.RN to call report      prior to discharge.  Clinical Social Worker will sign off for now as social work intervention is no longer needed. Please consult [place] again if new need arises.  [facility] Clinical Social Work Dept Weekend Social Worker [phone] 1:16 PM

## 2016-04-16 NOTE — Discharge Instructions (Signed)
Near-Syncope Introduction Near-syncope is when you suddenly become weak or dizzy, or you feel like you might pass out (faint). During an episode of near-syncope, you may:  Feel dizzy or light-headed.  Feel nauseous.  See all white or all black in your field of vision.  Have cold, clammy skin. This condition is caused by a sudden decrease in blood flow to the brain. This decrease can result from various causes, but most of those causes are not dangerous. However, near-syncope can be a sign of a serious medical problem, so it is important to seek medical care. If you fainted, get medical help right away.Call your local emergency services (911 in the [place] Do not drive yourself to the hospital. Follow these instructions at home: Pay attention to any changes in your symptoms. Take these actions to help with your condition:  Have someone stay with you until you feel stable.  Do not drive, use machinery, or play sports until your health care provider says it is okay.  Keep all follow-up visits as told by your health care provider. This is important.  If you start to feel like you might faint, lie down right away and raise (elevate) your feet above the level of your heart. Breathe deeply and steadily. Wait until all of the symptoms have passed.  Drink enough fluid to keep your urine clear or pale yellow.  If you are taking blood pressure or heart medicine, get up slowly and take several minutes to sit and then stand. This can reduce dizziness.  Take over-the-counter and prescription medicines only as told by your health care provider. Get help right away if:  You have a severe headache.  You have unusual pain in your chest, abdomen, or back.  You are bleeding from your mouth or rectum, or you have black or tarry stool.  You have a very fast or irregular heartbeat (palpitations).  You faint once or repeatedly.  You have a seizure.  You are confused.  You have trouble  walking.  You have severe weakness.  You have vision problems. These symptoms may represent a serious problem that is an emergency. Do not wait to see if your symptoms will go away. Get medical help right away. Call your local emergency services (911 in the [place] Do not drive yourself to the hospital.  This information is not intended to replace advice given to you by your health care provider. Make sure you discuss any questions you have with your health care provider. Document Released: 03/20/2005 Document Revised: 08/26/2015 Document Reviewed: 12/02/2014  2017 Elsevier   Syncope Syncope is when you temporarily lose consciousness. Syncope may also be called fainting or passing out. It is caused by a sudden decrease in blood flow to the brain. Even though most causes of syncope are not dangerous, syncope can be a sign of a serious medical problem. Signs that you may be about to faint include:  Feeling dizzy or light-headed.  Feeling nauseous.  Seeing all white or all black in your field of vision.  Having cold, clammy skin. If you fainted, get medical help right away.Call your local emergency services (911 in the [place] Do not drive yourself to the hospital. Follow these instructions at home: Pay attention to any changes in your symptoms. Take these actions to help with your condition:  Have someone stay with you until you feel stable.  Do not drive, use machinery, or play sports until your health care provider says it is okay.  Keep all  follow-up visits as told by your health care provider. This is important.  If you start to feel like you might faint, lie down right away and raise (elevate) your feet above the level of your heart. Breathe deeply and steadily. Wait until all of the symptoms have passed.  Drink enough fluid to keep your urine clear or pale yellow.  If you are taking blood pressure or heart medicine, get up slowly and take several minutes to sit and then stand.  This can reduce dizziness.  Take over-the-counter and prescription medicines only as told by your health care provider. Get help right away if:  You have a severe headache.  You have unusual pain in your chest, abdomen, or back.  You are bleeding from your mouth or rectum, or you have black or tarry stool.  You have a very fast or irregular heartbeat (palpitations).  You have pain with breathing.  You faint once or repeatedly.  You have a seizure.  You are confused.  You have trouble walking.  You have severe weakness.  You have vision problems. These symptoms may represent a serious problem that is an emergency. Do not wait to see if your symptoms will go away. Get medical help right away. Call your local emergency services (911 in the [place] Do not drive yourself to the hospital.  This information is not intended to replace advice given to you by your health care provider. Make sure you discuss any questions you have with your health care provider. Document Released: 03/20/2005 Document Revised: 08/26/2015 Document Reviewed: 12/02/2014 Elsevier Interactive Patient Education  2017 [employer].

## 2016-05-10 ENCOUNTER — Encounter (HOSPITAL_COMMUNITY): Payer: Self-pay

## 2016-05-10 ENCOUNTER — Emergency Department (HOSPITAL_COMMUNITY): Payer: Medicare PPO

## 2016-05-10 ENCOUNTER — Emergency Department (HOSPITAL_COMMUNITY)
Admission: EM | Admit: 2016-05-10 | Discharge: 2016-05-11 | Disposition: A | Discharge status: Home / Self Care | Payer: Medicare PPO | Attending: Emergency Medicine | Admitting: Emergency Medicine

## 2016-05-10 DIAGNOSIS — F0390 Unspecified dementia without behavioral disturbance: Secondary | ICD-10-CM | POA: Diagnosis not present

## 2016-05-10 DIAGNOSIS — Y92129 Unspecified place in nursing home as the place of occurrence of the external cause: Secondary | ICD-10-CM | POA: Insufficient documentation

## 2016-05-10 DIAGNOSIS — Y9389 Activity, other specified: Secondary | ICD-10-CM | POA: Diagnosis not present

## 2016-05-10 DIAGNOSIS — M25552 Pain in left hip: Secondary | ICD-10-CM | POA: Diagnosis not present

## 2016-05-10 DIAGNOSIS — M542 Cervicalgia: Secondary | ICD-10-CM | POA: Insufficient documentation

## 2016-05-10 DIAGNOSIS — Z79899 Other long term (current) drug therapy: Secondary | ICD-10-CM | POA: Insufficient documentation

## 2016-05-10 DIAGNOSIS — W07XXXA Fall from chair, initial encounter: Secondary | ICD-10-CM | POA: Insufficient documentation

## 2016-05-10 DIAGNOSIS — M546 Pain in thoracic spine: Secondary | ICD-10-CM | POA: Insufficient documentation

## 2016-05-10 DIAGNOSIS — Z7901 Long term (current) use of anticoagulants: Secondary | ICD-10-CM | POA: Insufficient documentation

## 2016-05-10 DIAGNOSIS — M545 Low back pain: Secondary | ICD-10-CM | POA: Insufficient documentation

## 2016-05-10 DIAGNOSIS — W19XXXA Unspecified fall, initial encounter: Secondary | ICD-10-CM

## 2016-05-10 DIAGNOSIS — S0990XA Unspecified injury of head, initial encounter: Secondary | ICD-10-CM | POA: Insufficient documentation

## 2016-05-10 DIAGNOSIS — Y999 Unspecified external cause status: Secondary | ICD-10-CM | POA: Insufficient documentation

## 2016-05-10 LAB — BASIC METABOLIC PANEL
Anion gap: 6 (ref 5–15)
BUN: 10 mg/dL (ref 6–20)
CO2: 31 mmol/L (ref 22–32)
Calcium: 8.7 mg/dL — ABNORMAL LOW (ref 8.9–10.3)
Chloride: 106 mmol/L (ref 101–111)
Creatinine, Ser: 0.77 mg/dL (ref 0.61–1.24)
GFR calc Af Amer: >60 mL/min (ref >60)
GFR calc non Af Amer: >60 mL/min (ref >60)
Glucose, Bld: 94 mg/dL (ref 65–99)
Potassium: 3.9 mmol/L (ref 3.5–5.1)
Sodium: 143 mmol/L (ref 135–145)

## 2016-05-10 LAB — CBC WITH DIFFERENTIAL/PLATELET
Basophils Absolute: 0 10*3/uL (ref 0.0–0.1)
Basophils Relative: 1 %
Eosinophils Absolute: 0.3 10*3/uL (ref 0.0–0.7)
Eosinophils Relative: 6 %
HCT: 41 % (ref 39.0–52.0)
Hemoglobin: 13.4 g/dL (ref 13.0–17.0)
Lymphocytes Relative: 29 %
Lymphs Abs: 1.3 10*3/uL (ref 0.7–4.0)
MCH: 29.8 pg (ref 26.0–34.0)
MCHC: 32.7 g/dL (ref 30.0–36.0)
MCV: 91.1 fL (ref 78.0–100.0)
Monocytes Absolute: 0.7 10*3/uL (ref 0.1–1.0)
Monocytes Relative: 16 %
Neutro Abs: 2.1 10*3/uL (ref 1.7–7.7)
Neutrophils Relative %: 48 %
Platelets: 153 10*3/uL (ref 150–400)
RBC: 4.5 MIL/uL (ref 4.22–5.81)
RDW: 13.4 % (ref 11.5–15.5)
WBC: 4.4 10*3/uL (ref 4.0–10.5)

## 2016-05-10 LAB — URINALYSIS, ROUTINE W REFLEX MICROSCOPIC
Bilirubin Urine: NEGATIVE
Glucose, UA: NEGATIVE mg/dL
Hgb urine dipstick: NEGATIVE
Ketones, ur: NEGATIVE mg/dL
Leukocytes, UA: NEGATIVE
Nitrite: NEGATIVE
Protein, ur: NEGATIVE mg/dL
Specific Gravity, Urine: 1.011 (ref 1.005–1.030)
pH: 7 (ref 5.0–8.0)

## 2016-05-10 LAB — PROTIME-INR
INR: 1.15
Prothrombin Time: 14.7 seconds (ref 11.4–15.2)

## 2016-05-10 LAB — APTT: aPTT: 28 seconds (ref 24–36)

## 2016-05-10 LAB — I-STAT TROPONIN, ED: Troponin i, poc: 0 ng/mL (ref 0.00–0.08)

## 2016-05-10 NOTE — ED Notes (Signed)
Call placed to [facility] x 2 to relay d/c information with  No answer and voicemail box is full.

## 2016-05-10 NOTE — ED Notes (Signed)
PT did not have to use RR at this time  

## 2016-05-10 NOTE — ED Notes (Signed)
CALLED [facility] FOR TRANSPORT °

## 2016-05-10 NOTE — ED Notes (Signed)
Pt returned from CT °

## 2016-05-10 NOTE — Discharge Instructions (Signed)
Continue taking at home medications as prescribed. Please have patient follow up with primary care provider for follow-up of today's visit. Please return to the emergency department if you develop any new or worsening symptoms.

## 2016-05-10 NOTE — ED Triage Notes (Signed)
Pt brought in by EMS due to falling out of chair. Pt is from [facility] and has baseline dementia. Pt can be verbally aggressive. Pt takes xarleto. Pt has hx of PE.

## 2016-05-10 NOTE — ED Notes (Signed)
Pt transported to CT ?

## 2016-05-10 NOTE — ED Provider Notes (Signed)
[facility] DEPT Provider Note   CSN: [id number] Arrival date & time: 05/10/16  1501     History   Chief Complaint Chief Complaint  Patient presents with  . Fall    HPI Luke Montgomery is a 83 y.o. male with history of dementia and pulmonary embolism anticoagulated on Xarelto who presents from nursing home following fall. Patient reports he was finished eating lunch and began feeling dizzy. Patient fell from his chair. He believes he hit his head. He is unsure if he lost consciousness. Patient reports left hip, left knee, and back pain, but no other pain. Patient does not feel dizzy at this time. Patient denies any symptoms including chest pain, shortness of breath, abdominal pain, nausea, vomiting. History may be limited or inaccurate due to patient's dementia, patient told Dr. Sherry Ruffing that he slipped and fell.   I spoke with Joanell Rising at [facility] who stated that the fall was unwitnessed and patient was found fallen out of his wheelchair on the floor on his back in the activity room.   HPI  Past Medical History:  Diagnosis Date  . Dementia   . DVT (deep venous thrombosis) ([place])   . Pulmonary embolism [facility])     Patient Active Problem List   Diagnosis Date Noted  . Syncope 04/15/2016  . Dementia with behavioral disturbance 10/12/2015    Past Surgical History:  Procedure Laterality Date  . HERNIA REPAIR    . SMALL INTESTINE SURGERY     secondary to removal of recurrent polyp       Home Medications    Prior to Admission medications   Medication Sig Start Date End Date Taking? Authorizing Provider  acetaminophen (TYLENOL) 500 MG tablet Take 500 mg by mouth every 4 (four) hours as needed for mild pain, moderate pain, fever or headache.    Historical Provider, MD  alum & mag hydroxide-simeth (MINTOX) 200-200-20 MG/5ML suspension Take 30 mLs by mouth as needed for indigestion or heartburn.    Historical Provider, MD  amantadine (SYMMETREL) 100 MG capsule Take  100 mg by mouth 2 (two) times daily.    Historical Provider, MD  busPIRone (BUSPAR) 10 MG tablet Take 10 mg by mouth 2 (two) times daily with a meal.    Historical Provider, MD  carbamazepine (TEGRETOL) 200 MG tablet Take 200 mg by mouth 2 (two) times daily with a meal.    Historical Provider, MD  cholecalciferol (VITAMIN D) 1000 units tablet Take 1,000 Units by mouth daily.    Historical Provider, MD  citalopram (CELEXA) 20 MG tablet Take 20 mg by mouth daily.    Historical Provider, MD  divalproex (DEPAKOTE SPRINKLE) 125 MG capsule Take 125 mg by mouth 2 (two) times daily.    Historical Provider, MD  donepezil (ARICEPT) 10 MG tablet Take 10 mg by mouth at bedtime.    Historical Provider, MD  guaifenesin (ROBAFEN) 100 MG/5ML syrup Take 200 mg by mouth every 6 (six) hours as needed for cough.    Historical Provider, MD  loperamide (IMODIUM) 2 MG capsule Take 2 mg by mouth as needed for diarrhea or loose stools.    Historical Provider, MD  LORazepam (ATIVAN) 0.5 MG tablet Take 0.5 mg by mouth every 8 (eight) hours as needed (for agitation).    Historical Provider, MD  Melatonin 1 MG TABS Take 1 mg by mouth at bedtime.     Historical Provider, MD  memantine (NAMENDA XR) 14 MG CP24 24 hr capsule Take 14 mg  by mouth daily.    Historical Provider, MD  neomycin-bacitracin-polymyxin (NEOSPORIN) ointment Apply 1 application topically as needed for wound care.    Historical Provider, MD  OLANZapine (ZYPREXA) 10 MG tablet Take 10 mg by mouth daily with breakfast.    Historical Provider, MD  risperiDONE (RISPERDAL) 0.5 MG tablet Take 0.5 mg by mouth every 4 (four) hours as needed (for psychosis/agitation).    Historical Provider, MD  rivaroxaban (XARELTO) 20 MG TABS tablet Take 20 mg by mouth daily with supper.    Historical Provider, MD  tamsulosin (FLOMAX) 0.4 MG CAPS capsule Take 0.4 mg by mouth at bedtime.    Historical Provider, MD  traZODone (DESYREL) 50 MG tablet Take 50 mg by mouth at bedtime.     Historical Provider, MD    Family History Family History  Problem Relation Age of Onset  . Family history unknown: Yes    Social History Social History  Substance Use Topics  . Smoking status: Never Smoker  . Smokeless tobacco: Never Used  . Alcohol use No     Allergies   Dilaudid [hydromorphone hcl] and Penicillins   Review of Systems Review of Systems  Unable to perform ROS: Dementia  Constitutional: Negative for chills and fever.  Respiratory: Negative for shortness of breath.   Cardiovascular: Negative for chest pain.  Gastrointestinal: Negative for abdominal pain, nausea and vomiting.  Musculoskeletal: Positive for arthralgias and back pain.  Skin: Negative for rash and wound.  Neurological: Negative for dizziness.     Physical Exam Updated Vital Signs BP 191/76 (BP Location: Right Arm)   Pulse 65   Temp 97.6 F (36.4 C) (Oral)   Resp 18   Ht 5\' 11"  (1.803 m)   Wt 68 kg   SpO2 98%   BMI 20.92 kg/m   Physical Exam  Constitutional: He appears well-developed and well-nourished. No distress.  HENT:  Head: Normocephalic and atraumatic.  Mouth/Throat: Oropharynx is clear and moist. No oropharyngeal exudate.  Eyes: Conjunctivae are normal. Pupils are equal, round, and reactive to light. Right eye exhibits no discharge. Left eye exhibits no discharge. No scleral icterus.  Pinpoint pupils  Neck: Normal range of motion. Neck supple. No thyromegaly present.  Cardiovascular: Normal rate, regular rhythm, normal heart sounds and intact distal pulses.  Exam reveals no gallop and no friction rub.   No murmur heard. Pulmonary/Chest: Effort normal and breath sounds normal. No stridor. No respiratory distress. He has no wheezes. He has no rales.  Abdominal: Soft. Bowel sounds are normal. He exhibits no distension. There is no tenderness. There is no rebound and no guarding.  Musculoskeletal: He exhibits no edema.       Left hip: He exhibits bony tenderness.        Cervical back: He exhibits tenderness and bony tenderness.       Thoracic back: He exhibits bony tenderness.       Lumbar back: He exhibits bony tenderness.       Back:       Legs: Lymphadenopathy:    He has no cervical adenopathy.  Neurological: He is alert. Coordination normal. GCS eye subscore is 4. GCS verbal subscore is 5. GCS motor subscore is 6.  Normal sensation and 5/5 strength to lower extremities  Skin: Skin is warm and dry. No rash noted. He is not diaphoretic. No pallor.  Psychiatric: He has a normal mood and affect.  Nursing note and vitals reviewed.    ED Treatments / Results  Labs (all  labs ordered are listed, but only abnormal results are displayed) Labs Reviewed  BASIC METABOLIC PANEL - Abnormal; Notable for the following:       Result Value   Calcium 8.7 (*)    All other components within normal limits  CBC WITH DIFFERENTIAL/PLATELET  URINALYSIS, ROUTINE W REFLEX MICROSCOPIC  PROTIME-INR  APTT  I-STAT TROPOININ, ED    EKG  EKG Interpretation None       Radiology Dg Chest 1 View  Result Date: 05/10/2016 CLINICAL DATA:  [place] today, dementia, history of prior DVT and pulmonary embolism EXAM: CHEST 1 VIEW COMPARISON:  04/14/2016 FINDINGS: Minimal enlargement of cardiac silhouette with pulmonary vascular congestion. Mediastinal contours normal. Atherosclerotic calcification aorta. Bibasilar atelectasis. Remaining lungs clear. No definite acute infiltrate, pleural effusion or pneumothorax. Bones diffusely demineralized. IMPRESSION: Minimal enlargement of cardiac silhouette with pulmonary vascular congestion. Bibasilar atelectasis. Aortic atherosclerosis. Electronically Signed   By: Lavonia Dana M.D.   On: 05/10/2016 16:58   Dg Thoracic Spine 2 View  Result Date: 05/10/2016 CLINICAL DATA:  [place] today, dementia EXAM: THORACIC SPINE 2 VIEWS COMPARISON:  Chest radiographs 04/14/2016 FINDINGS: Diffuse osseous demineralization. 12 pairs of ribs. Multilevel  degenerative disc disease changes with disc space narrowing and endplate spur formation. Vertebral body heights maintained without fracture or subluxation. Visualized posterior ribs intact. Atherosclerotic calcification aorta. IMPRESSION: Osseous demineralization with degenerative disc disease changes thoracic spine. No definite acute thoracic spine abnormalities. Aortic atherosclerosis. Electronically Signed   By: Lavonia Dana M.D.   On: 05/10/2016 16:44   Dg Lumbar Spine Complete  Result Date: 05/10/2016 CLINICAL DATA:  [place] today, dementia EXAM: LUMBAR SPINE - COMPLETE 4+ VIEW COMPARISON:  CT abdomen pelvis 06/10/2010 FINDINGS: 5 non-rib-bearing lumbar vertebra. Multilevel disc space narrowing and endplate spur formation. Diffuse osseous demineralization. Minimal chronic anterior height loss at L2 and L4. No definite acute fracture, subluxation, or bone destruction. No spondylolysis. SI joints preserved. IMPRESSION: Osseous demineralization with multilevel degenerative disc disease changes. Minimal chronic anterior height losses at L2 and L4. No definite acute bony abnormalities. Electronically Signed   By: Lavonia Dana M.D.   On: 05/10/2016 16:46   Ct Head Wo Contrast  Result Date: 05/10/2016 CLINICAL DATA:  Fall from chair while on blood thinners EXAM: CT HEAD WITHOUT CONTRAST CT CERVICAL SPINE WITHOUT CONTRAST TECHNIQUE: Multidetector CT imaging of the head and cervical spine was performed following the standard protocol without intravenous contrast. Multiplanar CT image reconstructions of the cervical spine were also generated. COMPARISON:  04/14/2016 FINDINGS: CT HEAD FINDINGS Brain: No evidence of acute infarction, hemorrhage, hydrocephalus, extra-axial collection or mass lesion/mass effect. Atrophic changes are noted as well as chronic white matter ischemic change. The overall appearance is stable from the prior study. Vascular: No hyperdense vessel or unexpected calcification. Skull: Normal. Negative  for fracture or focal lesion. Sinuses/Orbits: Considerable mucosal thickening is noted within the left maxillary antrum. Other: None. CT CERVICAL SPINE FINDINGS Alignment: Stable alignment Skull base and vertebrae: 7 cervical segments are well visualized. Vertebral body height is well maintained. Multilevel disc space narrowing is noted. Osteophytic changes are noted throughout the cervical spine. This contributes to bilateral neural foraminal narrowing as well as central canal stenosis at multiple levels. No acute fracture or acute facet abnormality is noted. The odontoid is within normal limits. Soft tissues and spinal canal: No acute soft tissue abnormality is noted. An isthmic thyroid nodule measuring just under 1 cm is noted Disc levels: Multilevel disc space narrowing is noted as described above from C2-C7.  Upper chest: Within normal limits. IMPRESSION: CT of the head: Atrophic and ischemic changes without acute abnormality. CT of cervical spine: Multilevel degenerative change without acute abnormality. Isthmic thyroid nodule measuring just under 1 cm. Electronically Signed   By: Inez Catalina M.D.   On: 05/10/2016 16:09   Ct Cervical Spine Wo Contrast  Result Date: 05/10/2016 CLINICAL DATA:  Fall from chair while on blood thinners EXAM: CT HEAD WITHOUT CONTRAST CT CERVICAL SPINE WITHOUT CONTRAST TECHNIQUE: Multidetector CT imaging of the head and cervical spine was performed following the standard protocol without intravenous contrast. Multiplanar CT image reconstructions of the cervical spine were also generated. COMPARISON:  04/14/2016 FINDINGS: CT HEAD FINDINGS Brain: No evidence of acute infarction, hemorrhage, hydrocephalus, extra-axial collection or mass lesion/mass effect. Atrophic changes are noted as well as chronic white matter ischemic change. The overall appearance is stable from the prior study. Vascular: No hyperdense vessel or unexpected calcification. Skull: Normal. Negative for fracture or  focal lesion. Sinuses/Orbits: Considerable mucosal thickening is noted within the left maxillary antrum. Other: None. CT CERVICAL SPINE FINDINGS Alignment: Stable alignment Skull base and vertebrae: 7 cervical segments are well visualized. Vertebral body height is well maintained. Multilevel disc space narrowing is noted. Osteophytic changes are noted throughout the cervical spine. This contributes to bilateral neural foraminal narrowing as well as central canal stenosis at multiple levels. No acute fracture or acute facet abnormality is noted. The odontoid is within normal limits. Soft tissues and spinal canal: No acute soft tissue abnormality is noted. An isthmic thyroid nodule measuring just under 1 cm is noted Disc levels: Multilevel disc space narrowing is noted as described above from C2-C7. Upper chest: Within normal limits. IMPRESSION: CT of the head: Atrophic and ischemic changes without acute abnormality. CT of cervical spine: Multilevel degenerative change without acute abnormality. Isthmic thyroid nodule measuring just under 1 cm. Electronically Signed   By: Inez Catalina M.D.   On: 05/10/2016 16:09   Ct Knee Left Wo Contrast  Result Date: 05/10/2016 CLINICAL DATA:  Pain after fall. Concern for possible tibial plateau fracture due to downsloping of medial tibial plateau on same day radiographs. EXAM: CT OF THE LEFT KNEE WITHOUT CONTRAST TECHNIQUE: Multidetector CT imaging of the left knee was performed according to the standard protocol. Multiplanar CT image reconstructions were also generated. COMPARISON:  05/10/2016 radiographs of the left knee. FINDINGS: Bones/Joint/Cartilage Tricompartmental osteoarthritis without acute fracture. No joint effusion. Downsloping of the medial tibial plateau may be related to osteoarthritis or old remote healed trauma. Small ossifications in the medial femorotibial compartment may reflect small intra-articular loose bodies or meniscal ossicles. Ligaments Suboptimally  assessed by CT. Muscles and Tendons No intramuscular hematoma or hemorrhage. The extensor tendons appear intact. Soft tissues Negative IMPRESSION: Tricompartmental osteoarthritis without acute fracture nor joint effusion. Small meniscal ossicles or intra articular loose bodies in the medial femorotibial compartment. Electronically Signed   By: Ashley Royalty M.D.   On: 05/10/2016 17:54   Dg Knee Complete 4 Views Left  Result Date: 05/10/2016 CLINICAL DATA:  [place] today, dementia EXAM: LEFT KNEE - COMPLETE 4+ VIEW COMPARISON:  06/16/2008 FINDINGS: Osseous demineralization. Scattered joint space narrowing and marginal spur formation most severe at the patellofemoral joint. Downsloping of the medial tibial plateau is identified new since the prior exam. While no definite acute fracture plane or joint effusion are identified, fracture of the medial tubal plateau is not completely excluded. No additional fracture, dislocation or bone destruction. Few scattered atherosclerotic calcifications. IMPRESSION: Osseous demineralization with  osteoarthritic changes LEFT knee. New downsloping of the medial tibial plateau since 2010, unable to exclude acute fracture ; if patient has pain and/or tenderness at this site, recommend CT assessment. Electronically Signed   By: Lavonia Dana M.D.   On: 05/10/2016 16:57   Dg Hip Unilat W Or Wo Pelvis 2-3 Views Left  Result Date: 05/10/2016 CLINICAL DATA:  [place] today, dementia EXAM: DG HIP (WITH OR WITHOUT PELVIS) 2-3V LEFT COMPARISON:  None FINDINGS: Diffuse osseous demineralization. BILATERAL hip joint space narrowing and spur formation. SI joints preserved. Degenerative disc disease changes present at visualized lower lumbar spine. No acute fracture, dislocation, or bone destruction. IMPRESSION: Osseous demineralization with degenerative changes of both hip joints. Degenerative disc disease changes lumbar spine. No definite acute bony abnormalities. Electronically Signed   By: Lavonia Dana  M.D.   On: 05/10/2016 16:46    Procedures Procedures (including critical care time)  Medications Ordered in ED Medications - No data to display   Initial Impression / Assessment and Plan / ED Course  I have reviewed the triage vital signs and the nursing notes.  Pertinent labs & imaging results that were available during my care of the patient were reviewed by me and considered in my medical decision making (see chart for details).     Patient with fall, unsure if he can were not. CBC unremarkable. BMP is calcium 8.7. Troponin negative. UA negative. PT, INR, APTT WNL. CT of the head and C-spine show no acute abnormality. CXR shows minimal enlargement of the cardiac silhouette with pulmonary vascular congestion, bibasilar atelectasis. X-ray of the left knee had a suspicion for tibial plateau fracture, however CT shows no acute fracture or joint effusion. Thoracic and lumbar x-rays show osseous demineralization with multilevel degenerative disc changes, but no definite acute bony abnormalities. Patient with stable vitals throughout ED course. Patient was stable to be discharged back home to assisted living facility. Return precautions outlined on discharge paperwork for nursing facility staff. Follow-up to PCP. Patient discharged in satisfactory condition. Patient also evaluated by Dr. Sherry Ruffing who guided the patient's management and agrees with plan.  Final Clinical Impressions(s) / ED Diagnoses   Final diagnoses:  Fall    New Prescriptions New Prescriptions   No medications on file     Frederica Kuster, Hershal Coria 05/10/16 [place], MD 05/12/16 1344

## 2016-06-01 ENCOUNTER — Emergency Department (HOSPITAL_COMMUNITY): Payer: Medicare PPO

## 2016-06-01 ENCOUNTER — Emergency Department (HOSPITAL_COMMUNITY)
Admission: EM | Admit: 2016-06-01 | Discharge: 2016-06-02 | Disposition: A | Discharge status: Home / Self Care | Payer: Medicare PPO | Attending: Emergency Medicine | Admitting: Emergency Medicine

## 2016-06-01 DIAGNOSIS — Y92129 Unspecified place in nursing home as the place of occurrence of the external cause: Secondary | ICD-10-CM | POA: Diagnosis not present

## 2016-06-01 DIAGNOSIS — Y999 Unspecified external cause status: Secondary | ICD-10-CM | POA: Insufficient documentation

## 2016-06-01 DIAGNOSIS — W19XXXA Unspecified fall, initial encounter: Secondary | ICD-10-CM

## 2016-06-01 DIAGNOSIS — W050XXA Fall from non-moving wheelchair, initial encounter: Secondary | ICD-10-CM | POA: Insufficient documentation

## 2016-06-01 DIAGNOSIS — Z7901 Long term (current) use of anticoagulants: Secondary | ICD-10-CM | POA: Diagnosis not present

## 2016-06-01 DIAGNOSIS — Y9389 Activity, other specified: Secondary | ICD-10-CM | POA: Insufficient documentation

## 2016-06-01 DIAGNOSIS — T148XXA Other injury of unspecified body region, initial encounter: Secondary | ICD-10-CM

## 2016-06-01 DIAGNOSIS — S0091XA Abrasion of unspecified part of head, initial encounter: Secondary | ICD-10-CM | POA: Diagnosis not present

## 2016-06-01 DIAGNOSIS — S0990XA Unspecified injury of head, initial encounter: Secondary | ICD-10-CM | POA: Diagnosis present

## 2016-06-01 NOTE — ED Triage Notes (Signed)
Patient from [facility].  Patient fell forward out of his wheelchair and struck the right side of his head.  Patient has  alzheimer's and is also on Rivaroxaban. No LOC.  Patient is currently at baseline.

## 2016-06-01 NOTE — ED Provider Notes (Signed)
[place] DEPT Provider Note   CSN: [id number] Arrival date & time: 06/01/16  1619     History   Chief Complaint Chief Complaint  Patient presents with  . Fall    HPI Luke Montgomery is a 83 y.o. male.  Mechanical fall out of wheel chair hitting head. Xarelto. Not ablet o provide other history.     Fall  This is a new problem. The current episode started less than 1 hour ago. The problem occurs rarely. The problem has been resolved. Nothing aggravates the symptoms. Nothing relieves the symptoms.  LEVEL V CAVEAT DUE TO DEMENTIA  Past Medical History:  Diagnosis Date  . Dementia   . DVT (deep venous thrombosis) ([place])   . Pulmonary embolism [facility])     Patient Active Problem List   Diagnosis Date Noted  . Syncope 04/15/2016  . Dementia with behavioral disturbance 10/12/2015    Past Surgical History:  Procedure Laterality Date  . HERNIA REPAIR    . SMALL INTESTINE SURGERY     secondary to removal of recurrent polyp       Home Medications    Prior to Admission medications   Medication Sig Start Date End Date Taking? Authorizing Provider  acetaminophen (TYLENOL) 500 MG tablet Take 500 mg by mouth every 4 (four) hours as needed for mild pain, moderate pain, fever or headache.    Historical Provider, MD  alum & mag hydroxide-simeth (MINTOX) 200-200-20 MG/5ML suspension Take 30 mLs by mouth as needed for indigestion or heartburn.    Historical Provider, MD  amantadine (SYMMETREL) 100 MG capsule Take 100 mg by mouth 2 (two) times daily.    Historical Provider, MD  busPIRone (BUSPAR) 10 MG tablet Take 10 mg by mouth 2 (two) times daily with a meal.    Historical Provider, MD  carbamazepine (TEGRETOL) 200 MG tablet Take 200 mg by mouth 2 (two) times daily with a meal.    Historical Provider, MD  cholecalciferol (VITAMIN D) 1000 units tablet Take 1,000 Units by mouth daily.    Historical Provider, MD  citalopram (CELEXA) 20 MG tablet Take 20 mg by mouth daily.     Historical Provider, MD  divalproex (DEPAKOTE SPRINKLE) 125 MG capsule Take 125 mg by mouth 2 (two) times daily.    Historical Provider, MD  donepezil (ARICEPT) 10 MG tablet Take 10 mg by mouth at bedtime.    Historical Provider, MD  guaifenesin (ROBAFEN) 100 MG/5ML syrup Take 200 mg by mouth every 6 (six) hours as needed for cough.    Historical Provider, MD  loperamide (IMODIUM) 2 MG capsule Take 2 mg by mouth as needed for diarrhea or loose stools.    Historical Provider, MD  LORazepam (ATIVAN) 0.5 MG tablet Take 0.5 mg by mouth every 8 (eight) hours as needed (for agitation).    Historical Provider, MD  Melatonin 1 MG TABS Take 1 mg by mouth at bedtime.     Historical Provider, MD  memantine (NAMENDA XR) 14 MG CP24 24 hr capsule Take 14 mg by mouth daily.    Historical Provider, MD  neomycin-bacitracin-polymyxin (NEOSPORIN) ointment Apply 1 application topically as needed for wound care.    Historical Provider, MD  OLANZapine (ZYPREXA) 10 MG tablet Take 10 mg by mouth daily with breakfast.    Historical Provider, MD  risperiDONE (RISPERDAL) 0.5 MG tablet Take 0.5 mg by mouth every 4 (four) hours as needed (for psychosis/agitation).    Historical Provider, MD  rivaroxaban (XARELTO) 20 MG TABS  tablet Take 20 mg by mouth daily with supper.    Historical Provider, MD  tamsulosin (FLOMAX) 0.4 MG CAPS capsule Take 0.4 mg by mouth at bedtime.    Historical Provider, MD  traZODone (DESYREL) 50 MG tablet Take 50 mg by mouth at bedtime.    Historical Provider, MD    Family History Family History  Problem Relation Age of Onset  . Family history unknown: Yes    Social History Social History  Substance Use Topics  . Smoking status: Never Smoker  . Smokeless tobacco: Never Used  . Alcohol use No     Allergies   Dilaudid [hydromorphone hcl] and Penicillins   Review of Systems Review of Systems  Unable to perform ROS: Dementia     Physical Exam Updated Vital Signs BP 179/72 (BP  Location: Left Arm)   Pulse 62   Temp 97.6 F (36.4 C) (Oral)   Resp 25   SpO2 98%   Physical Exam  Constitutional: He appears well-developed and well-nourished.  HENT:  Head: Normocephalic and atraumatic.  Eyes: Conjunctivae and EOM are normal.  Neck: Normal range of motion.  Cardiovascular: Normal rate.   Pulmonary/Chest: Effort normal. No respiratory distress.  Abdominal: Soft. He exhibits no distension.  Musculoskeletal: Normal range of motion.  Neurological: He is alert.  Skin: Skin is warm and dry.  Small abrasion to right side of head  Nursing note and vitals reviewed.    ED Treatments / Results  Labs (all labs ordered are listed, but only abnormal results are displayed) Labs Reviewed - No data to display  EKG  EKG Interpretation None       Radiology Ct Head Wo Contrast  Result Date: 06/01/2016 CLINICAL DATA:  Status post fall.  No loss consciousness. EXAM: CT HEAD WITHOUT CONTRAST CT CERVICAL SPINE WITHOUT CONTRAST TECHNIQUE: Multidetector CT imaging of the head and cervical spine was performed following the standard protocol without intravenous contrast. Multiplanar CT image reconstructions of the cervical spine were also generated. COMPARISON:  None. FINDINGS: CT HEAD FINDINGS Brain: No evidence of acute infarction, hemorrhage, extra-axial collection, ventriculomegaly, or mass effect. Generalized cerebral atrophy. Periventricular white matter low attenuation likely secondary to microangiopathy. Vascular: Cerebrovascular atherosclerotic calcifications are noted. Skull: Negative for fracture or focal lesion. Sinuses/Orbits: Visualized portions of the orbits are unremarkable. Visualized portions of the paranasal sinuses and mastoid air cells are unremarkable. Other: None. CT CERVICAL SPINE FINDINGS Alignment: Normal. Skull base and vertebrae: No acute fracture. No primary bone lesion or focal pathologic process. Soft tissues and spinal canal: No prevertebral fluid or  swelling. No visible canal hematoma. Disc levels: Degenerative disc disease with disc height loss at C2-3, C3-4, C4-5, C5-6 and C6-7. Bilateral foraminal stenosis at C2-3. Broad-based disc osteophyte complex at C3-4 with bilateral uncovertebral degenerative changes and facet arthropathy resulting in foraminal stenosis bilaterally. At C4-5 there is a broad-based disc osteophyte complex, bilateral uncovertebral degenerative changes, bilateral facet arthropathy and severe bilateral foraminal stenosis. At C5-6 there is bilateral facet arthropathy and bilateral mild foraminal narrowing. At C6-7 there is mild bilateral foraminal narrowing. Upper chest: Lung apices are clear. Other: Mild bilateral carotid artery atherosclerosis. IMPRESSION: 1. No acute intracranial pathology. 2. No acute osseous injury of the cervical spine. Electronically Signed   By: Kathreen Devoid   On: 06/01/2016 17:20   Ct Cervical Spine Wo Contrast  Result Date: 06/01/2016 CLINICAL DATA:  Status post fall.  No loss consciousness. EXAM: CT HEAD WITHOUT CONTRAST CT CERVICAL SPINE WITHOUT CONTRAST TECHNIQUE: Multidetector  CT imaging of the head and cervical spine was performed following the standard protocol without intravenous contrast. Multiplanar CT image reconstructions of the cervical spine were also generated. COMPARISON:  None. FINDINGS: CT HEAD FINDINGS Brain: No evidence of acute infarction, hemorrhage, extra-axial collection, ventriculomegaly, or mass effect. Generalized cerebral atrophy. Periventricular white matter low attenuation likely secondary to microangiopathy. Vascular: Cerebrovascular atherosclerotic calcifications are noted. Skull: Negative for fracture or focal lesion. Sinuses/Orbits: Visualized portions of the orbits are unremarkable. Visualized portions of the paranasal sinuses and mastoid air cells are unremarkable. Other: None. CT CERVICAL SPINE FINDINGS Alignment: Normal. Skull base and vertebrae: No acute fracture. No  primary bone lesion or focal pathologic process. Soft tissues and spinal canal: No prevertebral fluid or swelling. No visible canal hematoma. Disc levels: Degenerative disc disease with disc height loss at C2-3, C3-4, C4-5, C5-6 and C6-7. Bilateral foraminal stenosis at C2-3. Broad-based disc osteophyte complex at C3-4 with bilateral uncovertebral degenerative changes and facet arthropathy resulting in foraminal stenosis bilaterally. At C4-5 there is a broad-based disc osteophyte complex, bilateral uncovertebral degenerative changes, bilateral facet arthropathy and severe bilateral foraminal stenosis. At C5-6 there is bilateral facet arthropathy and bilateral mild foraminal narrowing. At C6-7 there is mild bilateral foraminal narrowing. Upper chest: Lung apices are clear. Other: Mild bilateral carotid artery atherosclerosis. IMPRESSION: 1. No acute intracranial pathology. 2. No acute osseous injury of the cervical spine. Electronically Signed   By: Kathreen Devoid   On: 06/01/2016 17:20    Procedures Procedures (including critical care time)  Medications Ordered in ED Medications - No data to display   Initial Impression / Assessment and Plan / ED Course  I have reviewed the triage vital signs and the nursing notes.  Pertinent labs & imaging results that were available during my care of the patient were reviewed by me and considered in my medical decision making (see chart for details).   No injuries noted. Ct ok. Baseline mental status per EMT. Wound care by nursing. otherwise stable for discharge.   Final Clinical Impressions(s) / ED Diagnoses   Final diagnoses:  Fall, initial encounter     Merrily Pew, MD 06/01/16 [phone]

## 2016-06-02 NOTE — ED Notes (Signed)
RN contacted dispatch to inquire about transport. Was told that pt was not on the PTAR list.  Will add him and look into the situation

## 2016-06-10 ENCOUNTER — Emergency Department (HOSPITAL_COMMUNITY): Payer: Medicare PPO

## 2016-06-10 ENCOUNTER — Encounter (HOSPITAL_COMMUNITY): Payer: Self-pay

## 2016-06-10 ENCOUNTER — Emergency Department (HOSPITAL_COMMUNITY)
Admission: EM | Admit: 2016-06-10 | Discharge: 2016-06-10 | Disposition: A | Discharge status: Home / Self Care | Payer: Medicare PPO | Attending: Emergency Medicine | Admitting: Emergency Medicine

## 2016-06-10 DIAGNOSIS — Y939 Activity, unspecified: Secondary | ICD-10-CM | POA: Diagnosis not present

## 2016-06-10 DIAGNOSIS — Y92129 Unspecified place in nursing home as the place of occurrence of the external cause: Secondary | ICD-10-CM | POA: Insufficient documentation

## 2016-06-10 DIAGNOSIS — Z7901 Long term (current) use of anticoagulants: Secondary | ICD-10-CM | POA: Diagnosis not present

## 2016-06-10 DIAGNOSIS — W19XXXA Unspecified fall, initial encounter: Secondary | ICD-10-CM | POA: Insufficient documentation

## 2016-06-10 DIAGNOSIS — S0083XA Contusion of other part of head, initial encounter: Secondary | ICD-10-CM | POA: Insufficient documentation

## 2016-06-10 DIAGNOSIS — S0990XA Unspecified injury of head, initial encounter: Secondary | ICD-10-CM | POA: Diagnosis present

## 2016-06-10 DIAGNOSIS — Z23 Encounter for immunization: Secondary | ICD-10-CM | POA: Insufficient documentation

## 2016-06-10 DIAGNOSIS — R296 Repeated falls: Secondary | ICD-10-CM

## 2016-06-10 DIAGNOSIS — Y999 Unspecified external cause status: Secondary | ICD-10-CM | POA: Diagnosis not present

## 2016-06-10 LAB — COMPREHENSIVE METABOLIC PANEL
ALT: 21 U/L (ref 17–63)
AST: 30 U/L (ref 15–41)
Albumin: 3.3 g/dL — ABNORMAL LOW (ref 3.5–5.0)
Alkaline Phosphatase: 89 U/L (ref 38–126)
Anion gap: 6 (ref 5–15)
BUN: 13 mg/dL (ref 6–20)
CO2: 30 mmol/L (ref 22–32)
Calcium: 8.6 mg/dL — ABNORMAL LOW (ref 8.9–10.3)
Chloride: 104 mmol/L (ref 101–111)
Creatinine, Ser: 1 mg/dL (ref 0.61–1.24)
GFR calc Af Amer: >60 mL/min (ref >60)
GFR calc non Af Amer: >60 mL/min (ref >60)
Glucose, Bld: 84 mg/dL (ref 65–99)
Potassium: 4.8 mmol/L (ref 3.5–5.1)
Sodium: 140 mmol/L (ref 135–145)
Total Bilirubin: 1 mg/dL (ref 0.3–1.2)
Total Protein: 5.2 g/dL — ABNORMAL LOW (ref 6.5–8.1)

## 2016-06-10 LAB — CBC WITH DIFFERENTIAL/PLATELET
Basophils Absolute: 0 10*3/uL (ref 0.0–0.1)
Basophils Relative: 1 %
EOS PCT: 8 %
Eosinophils Absolute: 0.3 10*3/uL (ref 0.0–0.7)
HCT: 43.2 % (ref 39.0–52.0)
Hemoglobin: 14 g/dL (ref 13.0–17.0)
LYMPHS ABS: 1.4 10*3/uL (ref 0.7–4.0)
LYMPHS PCT: 34 %
MCH: 29.7 pg (ref 26.0–34.0)
MCHC: 32.4 g/dL (ref 30.0–36.0)
MCV: 91.5 fL (ref 78.0–100.0)
Monocytes Absolute: 0.5 10*3/uL (ref 0.1–1.0)
Monocytes Relative: 13 %
Neutro Abs: 1.8 10*3/uL (ref 1.7–7.7)
Neutrophils Relative %: 44 %
PLATELETS: 128 10*3/uL — AB (ref 150–400)
RBC: 4.72 MIL/uL (ref 4.22–5.81)
RDW: 13.9 % (ref 11.5–15.5)
WBC: 4 10*3/uL (ref 4.0–10.5)

## 2016-06-10 MED ORDER — TETANUS-DIPHTH-ACELL PERTUSSIS 5-2.5-18.5 LF-MCG/0.5 IM SUSP
0.5000 mL | Freq: Once | INTRAMUSCULAR | Status: AC
  Administered 2016-06-10: 0.5 mL via INTRAMUSCULAR
  Filled 2016-06-10: qty 0.5

## 2016-06-10 NOTE — ED Notes (Signed)
Patient transported to X-ray 

## 2016-06-10 NOTE — ED Provider Notes (Signed)
[place] DEPT Provider Note   CSN: [id number] Arrival date & time:       History    Chief Complaint  Patient presents with  . Fall  . Head Injury  . Abrasion     HPI Luke Montgomery is a 83 y.o. male.  83yo M w/ PMH including dementia, DVT/PE on Xarelto who p/w fall. Pt brought in by EMS, who picked him up from nursing home where he had an unwitnessed fall this afternoon. He was found on the ground by staff. EMS reports that they were not able to give any further details as they were arguing while EMS was there. Pt has been comfortable during transport.  LEVEL 5 CAVEAT DUE TO DEMENTIA   Past Medical History:  Diagnosis Date  . Dementia   . DVT (deep venous thrombosis) ([place])   . Pulmonary embolism [facility])      Patient Active Problem List   Diagnosis Date Noted  . Syncope 04/15/2016  . Dementia with behavioral disturbance 10/12/2015    Past Surgical History:  Procedure Laterality Date  . HERNIA REPAIR    . SMALL INTESTINE SURGERY     secondary to removal of recurrent polyp        Home Medications    Prior to Admission medications   Medication Sig Start Date End Date Taking? Authorizing Provider  acetaminophen (TYLENOL) 500 MG tablet Take 500 mg by mouth every 4 (four) hours as needed for mild pain, moderate pain, fever or headache.   Yes Historical Provider, MD  alum & mag hydroxide-simeth (MINTOX) 200-200-20 MG/5ML suspension Take 30 mLs by mouth as needed for indigestion or heartburn.   Yes Historical Provider, MD  amantadine (SYMMETREL) 100 MG capsule Take 100 mg by mouth 2 (two) times daily.   Yes Historical Provider, MD  busPIRone (BUSPAR) 10 MG tablet Take 10 mg by mouth 2 (two) times daily with a meal.   Yes Historical Provider, MD  carbamazepine (TEGRETOL) 200 MG tablet Take 200 mg by mouth 2 (two) times daily with a meal.   Yes Historical Provider, MD  cholecalciferol (VITAMIN D) 1000 units tablet Take 1,000 Units by mouth daily.   Yes Historical  Provider, MD  citalopram (CELEXA) 20 MG tablet Take 20 mg by mouth daily.   Yes Historical Provider, MD  divalproex (DEPAKOTE SPRINKLE) 125 MG capsule Take 125 mg by mouth 2 (two) times daily.   Yes Historical Provider, MD  donepezil (ARICEPT) 10 MG tablet Take 10 mg by mouth at bedtime.   Yes Historical Provider, MD  guaifenesin (ROBAFEN) 100 MG/5ML syrup Take 200 mg by mouth every 6 (six) hours as needed for cough.   Yes Historical Provider, MD  loperamide (IMODIUM) 2 MG capsule Take 2 mg by mouth as needed for diarrhea or loose stools.   Yes Historical Provider, MD  LORazepam (ATIVAN) 0.5 MG tablet Take 0.5 mg by mouth every 8 (eight) hours as needed (for agitation).   Yes Historical Provider, MD  magnesium hydroxide (MILK OF MAGNESIA) 400 MG/5ML suspension Take 30 mLs by mouth daily as needed for mild constipation.   Yes Historical Provider, MD  Melatonin 1 MG TABS Take 1 mg by mouth at bedtime.    Yes Historical Provider, MD  memantine (NAMENDA XR) 14 MG CP24 24 hr capsule Take 14 mg by mouth daily.   Yes Historical Provider, MD  neomycin-bacitracin-polymyxin (NEOSPORIN) ointment Apply 1 application topically as needed for wound care.   Yes Historical Provider, MD  OLANZapine (  ZYPREXA) 10 MG tablet Take 10 mg by mouth daily with breakfast.   Yes Historical Provider, MD  risperiDONE (RISPERDAL) 0.5 MG tablet Take 0.5 mg by mouth every 4 (four) hours as needed (for psychosis/agitation).   Yes Historical Provider, MD  rivaroxaban (XARELTO) 20 MG TABS tablet Take 20 mg by mouth daily with supper.   Yes Historical Provider, MD  tamsulosin (FLOMAX) 0.4 MG CAPS capsule Take 0.4 mg by mouth at bedtime.   Yes Historical Provider, MD  traZODone (DESYREL) 50 MG tablet Take 50 mg by mouth at bedtime.   Yes Historical Provider, MD      Family History  Problem Relation Age of Onset  . Family history unknown: Yes     Social History  Substance Use Topics  . Smoking status: Never Smoker  . Smokeless  tobacco: Never Used  . Alcohol use No     Allergies     Dilaudid [hydromorphone hcl] and Penicillins    Review of Systems  UNABLE TO OBTAIN ROS 2/2 DEMENTIA  Physical Exam Updated Vital Signs BP 181/58 (BP Location: Right Arm)   Pulse (!) 58   Temp 97.7 F (36.5 C) (Oral)   Resp 22   SpO2 91%   Physical Exam  Constitutional: He appears well-developed and well-nourished. No distress.  HENT:  Head: Normocephalic.  Abrasion w/ dried blood L forehead; ecchymosis and swelling L cheek and periorbital ecchymosis  Moist mucous membranes  Eyes: Conjunctivae are normal. Pupils are equal, round, and reactive to light.  Pinpoint pupils  Neck: Neck supple.  Cardiovascular: Normal rate, regular rhythm and normal heart sounds.   No murmur heard. Pulmonary/Chest: Effort normal and breath sounds normal. He exhibits no tenderness.  Abdominal: Soft. Bowel sounds are normal. He exhibits no distension. There is no tenderness.  Musculoskeletal: He exhibits no edema or tenderness.  Neurological:  Opens eyes to voice but does not answer questions or follow commands, moving all 4 extremities; downgoing Babinski b/l  Skin: Skin is warm and dry.  Old ecchymoses and scabs scattered on arms  Nursing note and vitals reviewed.     ED Treatments / Results  Labs (all labs ordered are listed, but only abnormal results are displayed) Labs Reviewed  COMPREHENSIVE METABOLIC PANEL - Abnormal; Notable for the following:       Result Value   Calcium 8.6 (*)    Total Protein 5.2 (*)    Albumin 3.3 (*)    All other components within normal limits  CBC WITH DIFFERENTIAL/PLATELET - Abnormal; Notable for the following:    Platelets 128 (*)    All other components within normal limits     EKG  EKG Interpretation  Date/Time:    Ventricular Rate:    PR Interval:    QRS Duration:   QT Interval:    QTC Calculation:   R Axis:     Text Interpretation:           Radiology Ct Head Wo  Contrast  Result Date: 06/10/2016 CLINICAL DATA:  Unwitnessed fall. Swelling over the left cheek and orbital region. EXAM: CT HEAD WITHOUT CONTRAST CT MAXILLOFACIAL WITHOUT CONTRAST CT CERVICAL SPINE WITHOUT CONTRAST TECHNIQUE: Multidetector CT imaging of the head, cervical spine, and maxillofacial structures were performed using the standard protocol without intravenous contrast. Multiplanar CT image reconstructions of the cervical spine and maxillofacial structures were also generated. COMPARISON:  06/01/2016 head and cervical spine CT FINDINGS: CT HEAD FINDINGS Brain: There is no evidence for acute hemorrhage, hydrocephalus, mass  lesion, or abnormal extra-axial fluid collection. No definite CT evidence for acute infarction. Diffuse loss of parenchymal volume is consistent with atrophy. Patchy low attenuation in the deep hemispheric and periventricular white matter is nonspecific, but likely reflects chronic microvascular ischemic demyelination. Vascular: No hyperdense vessel or unexpected calcification. Skull: No evidence for fracture. No worrisome lytic or sclerotic lesion. Other: The visualized paranasal sinuses and mastoid air cells are clear. Visualized portions of the globes and intraorbital fat are unremarkable. CT MAXILLOFACIAL FINDINGS Osseous: No fracture or mandibular dislocation. No destructive process. Orbits: Negative. No traumatic or inflammatory finding. Sinuses: Clear. Soft tissues: Negative. CT CERVICAL SPINE FINDINGS Alignment: Mild straightening normal cervical lordosis. Skull base and vertebrae: No acute fracture. No primary bone lesion or focal pathologic process. Soft tissues and spinal canal: No prevertebral fluid or swelling. No visible canal hematoma. Disc levels: Loss of disc height with endplate degeneration is seen at C3-4, C4-5, and C6-7. Diffuse endplate spurring is seen at essentially all levels of the cervical spine. Relatively diffuse mild facet osteoarthritis noted bilaterally.  Upper chest: Negative. Other: None. IMPRESSION: 1. No acute intracranial abnormality. Atrophy with chronic small vessel white matter ischemic disease. 2. Degenerative changes without acute bony abnormality in the cervical spine. 3. No evidence for acute fracture involving the bony anatomy of the face. Electronically Signed   By: Misty Stanley M.D.   On: 06/10/2016 16:48   Ct Cervical Spine Wo Contrast  Result Date: 06/10/2016 CLINICAL DATA:  Unwitnessed fall. Swelling over the left cheek and orbital region. EXAM: CT HEAD WITHOUT CONTRAST CT MAXILLOFACIAL WITHOUT CONTRAST CT CERVICAL SPINE WITHOUT CONTRAST TECHNIQUE: Multidetector CT imaging of the head, cervical spine, and maxillofacial structures were performed using the standard protocol without intravenous contrast. Multiplanar CT image reconstructions of the cervical spine and maxillofacial structures were also generated. COMPARISON:  06/01/2016 head and cervical spine CT FINDINGS: CT HEAD FINDINGS Brain: There is no evidence for acute hemorrhage, hydrocephalus, mass lesion, or abnormal extra-axial fluid collection. No definite CT evidence for acute infarction. Diffuse loss of parenchymal volume is consistent with atrophy. Patchy low attenuation in the deep hemispheric and periventricular white matter is nonspecific, but likely reflects chronic microvascular ischemic demyelination. Vascular: No hyperdense vessel or unexpected calcification. Skull: No evidence for fracture. No worrisome lytic or sclerotic lesion. Other: The visualized paranasal sinuses and mastoid air cells are clear. Visualized portions of the globes and intraorbital fat are unremarkable. CT MAXILLOFACIAL FINDINGS Osseous: No fracture or mandibular dislocation. No destructive process. Orbits: Negative. No traumatic or inflammatory finding. Sinuses: Clear. Soft tissues: Negative. CT CERVICAL SPINE FINDINGS Alignment: Mild straightening normal cervical lordosis. Skull base and vertebrae: No  acute fracture. No primary bone lesion or focal pathologic process. Soft tissues and spinal canal: No prevertebral fluid or swelling. No visible canal hematoma. Disc levels: Loss of disc height with endplate degeneration is seen at C3-4, C4-5, and C6-7. Diffuse endplate spurring is seen at essentially all levels of the cervical spine. Relatively diffuse mild facet osteoarthritis noted bilaterally. Upper chest: Negative. Other: None. IMPRESSION: 1. No acute intracranial abnormality. Atrophy with chronic small vessel white matter ischemic disease. 2. Degenerative changes without acute bony abnormality in the cervical spine. 3. No evidence for acute fracture involving the bony anatomy of the face. Electronically Signed   By: Misty Stanley M.D.   On: 06/10/2016 16:48   Ct Maxillofacial Wo Cm  Result Date: 06/10/2016 CLINICAL DATA:  Unwitnessed fall. Swelling over the left cheek and orbital region. EXAM: CT HEAD WITHOUT  CONTRAST CT MAXILLOFACIAL WITHOUT CONTRAST CT CERVICAL SPINE WITHOUT CONTRAST TECHNIQUE: Multidetector CT imaging of the head, cervical spine, and maxillofacial structures were performed using the standard protocol without intravenous contrast. Multiplanar CT image reconstructions of the cervical spine and maxillofacial structures were also generated. COMPARISON:  06/01/2016 head and cervical spine CT FINDINGS: CT HEAD FINDINGS Brain: There is no evidence for acute hemorrhage, hydrocephalus, mass lesion, or abnormal extra-axial fluid collection. No definite CT evidence for acute infarction. Diffuse loss of parenchymal volume is consistent with atrophy. Patchy low attenuation in the deep hemispheric and periventricular white matter is nonspecific, but likely reflects chronic microvascular ischemic demyelination. Vascular: No hyperdense vessel or unexpected calcification. Skull: No evidence for fracture. No worrisome lytic or sclerotic lesion. Other: The visualized paranasal sinuses and mastoid air  cells are clear. Visualized portions of the globes and intraorbital fat are unremarkable. CT MAXILLOFACIAL FINDINGS Osseous: No fracture or mandibular dislocation. No destructive process. Orbits: Negative. No traumatic or inflammatory finding. Sinuses: Clear. Soft tissues: Negative. CT CERVICAL SPINE FINDINGS Alignment: Mild straightening normal cervical lordosis. Skull base and vertebrae: No acute fracture. No primary bone lesion or focal pathologic process. Soft tissues and spinal canal: No prevertebral fluid or swelling. No visible canal hematoma. Disc levels: Loss of disc height with endplate degeneration is seen at C3-4, C4-5, and C6-7. Diffuse endplate spurring is seen at essentially all levels of the cervical spine. Relatively diffuse mild facet osteoarthritis noted bilaterally. Upper chest: Negative. Other: None. IMPRESSION: 1. No acute intracranial abnormality. Atrophy with chronic small vessel white matter ischemic disease. 2. Degenerative changes without acute bony abnormality in the cervical spine. 3. No evidence for acute fracture involving the bony anatomy of the face. Electronically Signed   By: Misty Stanley M.D.   On: 06/10/2016 16:48    Procedures Procedures (including critical care time) Procedures  Medications Ordered in ED  Medications  Tdap (BOOSTRIX) injection 0.5 mL (0.5 mLs Intramuscular Given 06/10/16 1656)     Initial Impression / Assessment and Plan / ED Course  I have reviewed the triage vital signs and the nursing notes.  Pertinent labs & imaging results that were available during my care of the patient were reviewed by me and considered in my medical decision making (see chart for details).    PT w/ h/o frequent ED visits for falling brought in from nursing facility after unwitnessed fall today. He was sleepy but opened eyes to voice on exam. MAR from facility shows he received afternoon dose of ativan shortly prior to arrival here. He had abrasions and ecchymoses on  face but no extremity or trunk injuries. Obtained screening labs given non-verbal state, as well as CT head/c-spine/face. I reviewed his chart and cannot determine when his last tetanus update was, therefore ordered Tdap.  Basic labs are reassuring. Imaging negative for acute injury. Pt comfortable on re-examination. In discharge papers I emphasized importance of following up with PCP to discuss his current medication regimen as he is on benzos and multiple other medications that increase his fall risk, and I am concerned about his concurrent Xarelto use given his frequent falls. Pt discharged back to nursing facility.  Final Clinical Impressions(s) / ED Diagnoses   Final diagnoses:  Minor head injury, initial encounter  Unwitnessed fall     New Prescriptions   No medications on file       Sharlett Iles, MD 06/10/16 1734

## 2016-06-10 NOTE — Discharge Instructions (Signed)
I AM VERY CONCERNED THAT MR. Nylund HAS HAD MULTIPLE FALLS AND HEAD INJURIES RECENTLY WHILE ON XARELTO (BLOOD THINNER). HE IS ALSO ON MULTIPLE MEDICATIONS FOR MOOD STABILIZATION/AGITATION THAT CAN CAUSE BALANCE PROBLEMS IN THE ELDERLY. THESE MEDICATIONS MAY BE CONTRIBUTING TO HIS FALLS. PLEASE FOLLOW UP WITH HIS PRIMARY CARE PROVIDER TO DISCUSS HIS MEDICATION REGIMEN.

## 2016-06-10 NOTE — ED Notes (Signed)
Called report with MD instructions to [facility] @ [place].  Ms. Luke Montgomery verbalized understanding of discharge instructions.  PTAR called for transport.

## 2016-06-10 NOTE — ED Notes (Signed)
[facility] contacted to transport patient back to [facility]

## 2016-06-10 NOTE — ED Triage Notes (Signed)
To room via EMS.  Unwittnessed fall to floor from wheelchair.   EMS arrived on scene to find pt laying on floor with towel rolled under head. Bleeding from forehead.  Bleeding controlled at this time.  Swelling noted to forehead and around left eye with bruising.  Staff unable to provide EMS with information regarding baseline neuro status.  Pt opens eyes to voice.  Follows simple commands (squeeze hands, move feet).  Disoriented x 4.  Returns to sleep.

## 2016-08-29 ENCOUNTER — Encounter (HOSPITAL_COMMUNITY): Payer: Self-pay | Admitting: Emergency Medicine

## 2016-08-29 ENCOUNTER — Inpatient Hospital Stay (HOSPITAL_COMMUNITY)
Admission: EM | Admit: 2016-08-29 | Discharge: 2016-08-31 | DRG: 194 | Disposition: A | Discharge status: Nursing Home (Includes ICF) | Payer: Medicare PPO | Attending: Family Medicine | Admitting: Family Medicine

## 2016-08-29 ENCOUNTER — Emergency Department (HOSPITAL_COMMUNITY): Payer: Medicare PPO

## 2016-08-29 DIAGNOSIS — R531 Weakness: Secondary | ICD-10-CM

## 2016-08-29 DIAGNOSIS — N400 Enlarged prostate without lower urinary tract symptoms: Secondary | ICD-10-CM | POA: Diagnosis present

## 2016-08-29 DIAGNOSIS — Z880 Allergy status to penicillin: Secondary | ICD-10-CM

## 2016-08-29 DIAGNOSIS — Z7901 Long term (current) use of anticoagulants: Secondary | ICD-10-CM

## 2016-08-29 DIAGNOSIS — Z23 Encounter for immunization: Secondary | ICD-10-CM | POA: Diagnosis present

## 2016-08-29 DIAGNOSIS — Z86711 Personal history of pulmonary embolism: Secondary | ICD-10-CM | POA: Diagnosis not present

## 2016-08-29 DIAGNOSIS — Y95 Nosocomial condition: Secondary | ICD-10-CM | POA: Diagnosis present

## 2016-08-29 DIAGNOSIS — R05 Cough: Secondary | ICD-10-CM | POA: Diagnosis not present

## 2016-08-29 DIAGNOSIS — R1310 Dysphagia, unspecified: Secondary | ICD-10-CM | POA: Diagnosis present

## 2016-08-29 DIAGNOSIS — Z86718 Personal history of other venous thrombosis and embolism: Secondary | ICD-10-CM | POA: Diagnosis not present

## 2016-08-29 DIAGNOSIS — Z66 Do not resuscitate: Secondary | ICD-10-CM | POA: Diagnosis present

## 2016-08-29 DIAGNOSIS — R0902 Hypoxemia: Secondary | ICD-10-CM | POA: Diagnosis present

## 2016-08-29 DIAGNOSIS — J189 Pneumonia, unspecified organism: Principal | ICD-10-CM

## 2016-08-29 DIAGNOSIS — Z885 Allergy status to narcotic agent status: Secondary | ICD-10-CM | POA: Diagnosis not present

## 2016-08-29 DIAGNOSIS — F0391 Unspecified dementia with behavioral disturbance: Secondary | ICD-10-CM | POA: Diagnosis present

## 2016-08-29 DIAGNOSIS — I517 Cardiomegaly: Secondary | ICD-10-CM | POA: Diagnosis present

## 2016-08-29 DIAGNOSIS — F03918 Unspecified dementia, unspecified severity, with other behavioral disturbance: Secondary | ICD-10-CM | POA: Diagnosis present

## 2016-08-29 DIAGNOSIS — Z79899 Other long term (current) drug therapy: Secondary | ICD-10-CM

## 2016-08-29 LAB — URINALYSIS, ROUTINE W REFLEX MICROSCOPIC
BACTERIA UA: NONE SEEN
BILIRUBIN URINE: NEGATIVE
Glucose, UA: NEGATIVE mg/dL
HGB URINE DIPSTICK: NEGATIVE
Ketones, ur: NEGATIVE mg/dL
LEUKOCYTES UA: NEGATIVE
NITRITE: NEGATIVE
PH: 5 (ref 5.0–8.0)
Protein, ur: 30 mg/dL — AB
Specific Gravity, Urine: 1.027 (ref 1.005–1.030)
Squamous Epithelial / LPF: NONE SEEN

## 2016-08-29 LAB — CBC WITH DIFFERENTIAL/PLATELET
BASOS ABS: 0 10*3/uL (ref 0.0–0.1)
BASOS PCT: 0 %
Eosinophils Absolute: 0.1 10*3/uL (ref 0.0–0.7)
Eosinophils Relative: 1 %
HEMATOCRIT: 41.3 % (ref 39.0–52.0)
Hemoglobin: 13.4 g/dL (ref 13.0–17.0)
Lymphocytes Relative: 9 %
Lymphs Abs: 0.9 10*3/uL (ref 0.7–4.0)
MCH: 29.8 pg (ref 26.0–34.0)
MCHC: 32.4 g/dL (ref 30.0–36.0)
MCV: 92 fL (ref 78.0–100.0)
MONO ABS: 0.9 10*3/uL (ref 0.1–1.0)
Monocytes Relative: 9 %
NEUTROS ABS: 7.9 10*3/uL — AB (ref 1.7–7.7)
Neutrophils Relative %: 81 %
PLATELETS: 203 10*3/uL (ref 150–400)
RBC: 4.49 MIL/uL (ref 4.22–5.81)
RDW: 13.2 % (ref 11.5–15.5)
WBC: 9.9 10*3/uL (ref 4.0–10.5)

## 2016-08-29 LAB — I-STAT CG4 LACTIC ACID, ED
LACTIC ACID, VENOUS: 0.72 mmol/L (ref 0.5–1.9)
Lactic Acid, Venous: 0.88 mmol/L (ref 0.5–1.9)

## 2016-08-29 LAB — COMPREHENSIVE METABOLIC PANEL
ALK PHOS: 89 U/L (ref 38–126)
ALT: 31 U/L (ref 17–63)
ANION GAP: 9 (ref 5–15)
AST: 20 U/L (ref 15–41)
Albumin: 3.1 g/dL — ABNORMAL LOW (ref 3.5–5.0)
BILIRUBIN TOTAL: 0.7 mg/dL (ref 0.3–1.2)
BUN: 16 mg/dL (ref 6–20)
CALCIUM: 8.5 mg/dL — AB (ref 8.9–10.3)
CO2: 27 mmol/L (ref 22–32)
Chloride: 106 mmol/L (ref 101–111)
Creatinine, Ser: 0.8 mg/dL (ref 0.61–1.24)
GLUCOSE: 113 mg/dL — AB (ref 65–99)
POTASSIUM: 4.2 mmol/L (ref 3.5–5.1)
Sodium: 142 mmol/L (ref 135–145)
TOTAL PROTEIN: 6.1 g/dL — AB (ref 6.5–8.1)

## 2016-08-29 MED ORDER — AMANTADINE HCL 100 MG PO CAPS
100.0000 mg | ORAL_CAPSULE | Freq: Two times a day (BID) | ORAL | Status: DC
  Administered 2016-08-30 – 2016-08-31 (×4): 100 mg via ORAL
  Filled 2016-08-29 (×6): qty 1

## 2016-08-29 MED ORDER — RISPERIDONE 0.5 MG PO TABS: 0.5000 mg | ORAL_TABLET | ORAL | Status: DC | PRN

## 2016-08-29 MED ORDER — ACETAMINOPHEN 650 MG RE SUPP
650.0000 mg | Freq: Once | RECTAL | Status: AC
  Administered 2016-08-29: 650 mg via RECTAL
  Filled 2016-08-29: qty 1

## 2016-08-29 MED ORDER — CARBAMAZEPINE 200 MG PO TABS
200.0000 mg | ORAL_TABLET | Freq: Two times a day (BID) | ORAL | Status: DC
  Administered 2016-08-30 – 2016-08-31 (×5): 200 mg via ORAL
  Filled 2016-08-29 (×6): qty 1

## 2016-08-29 MED ORDER — BUSPIRONE HCL 5 MG PO TABS
10.0000 mg | ORAL_TABLET | Freq: Two times a day (BID) | ORAL | Status: DC
  Administered 2016-08-30 – 2016-08-31 (×4): 10 mg via ORAL
  Filled 2016-08-29 (×4): qty 2

## 2016-08-29 MED ORDER — DONEPEZIL HCL 10 MG PO TABS
10.0000 mg | ORAL_TABLET | Freq: Every day | ORAL | Status: DC
  Administered 2016-08-30 (×2): 10 mg via ORAL
  Filled 2016-08-29 (×2): qty 1

## 2016-08-29 MED ORDER — CITALOPRAM HYDROBROMIDE 20 MG PO TABS
20.0000 mg | ORAL_TABLET | Freq: Every day | ORAL | Status: DC
  Administered 2016-08-30 – 2016-08-31 (×2): 20 mg via ORAL
  Filled 2016-08-29 (×2): qty 1

## 2016-08-29 MED ORDER — RIVAROXABAN 20 MG PO TABS
20.0000 mg | ORAL_TABLET | Freq: Every day | ORAL | Status: DC
  Administered 2016-08-30 – 2016-08-31 (×3): 20 mg via ORAL
  Filled 2016-08-29 (×3): qty 1

## 2016-08-29 MED ORDER — TAMSULOSIN HCL 0.4 MG PO CAPS
0.4000 mg | ORAL_CAPSULE | Freq: Every day | ORAL | Status: DC
  Administered 2016-08-30 (×2): 0.4 mg via ORAL
  Filled 2016-08-29 (×2): qty 1

## 2016-08-29 MED ORDER — LORAZEPAM 0.5 MG PO TABS
0.5000 mg | ORAL_TABLET | Freq: Three times a day (TID) | ORAL | Status: DC | PRN
  Administered 2016-08-31: 0.5 mg via ORAL
  Filled 2016-08-29: qty 1

## 2016-08-29 MED ORDER — SODIUM CHLORIDE 0.9 % IV BOLUS (SEPSIS)
1000.0000 mL | Freq: Once | INTRAVENOUS | Status: AC
  Administered 2016-08-29: 1000 mL via INTRAVENOUS

## 2016-08-29 MED ORDER — GUAIFENESIN 100 MG/5ML PO SYRP
200.0000 mg | ORAL_SOLUTION | Freq: Four times a day (QID) | ORAL | Status: DC | PRN
  Filled 2016-08-29: qty 10

## 2016-08-29 MED ORDER — MEMANTINE HCL ER 14 MG PO CP24
14.0000 mg | ORAL_CAPSULE | Freq: Every day | ORAL | Status: DC
  Administered 2016-08-30 – 2016-08-31 (×2): 14 mg via ORAL
  Filled 2016-08-29 (×3): qty 1

## 2016-08-29 MED ORDER — RISPERIDONE 0.5 MG PO TABS: 0.5000 mg | ORAL_TABLET | Freq: Four times a day (QID) | ORAL | Status: DC | PRN

## 2016-08-29 MED ORDER — VANCOMYCIN HCL 500 MG IV SOLR
500.0000 mg | Freq: Two times a day (BID) | INTRAVENOUS | Status: DC
  Administered 2016-08-30: 500 mg via INTRAVENOUS
  Filled 2016-08-29 (×2): qty 500

## 2016-08-29 MED ORDER — OLANZAPINE 5 MG PO TABS
10.0000 mg | ORAL_TABLET | Freq: Every day | ORAL | Status: DC
  Administered 2016-08-30 – 2016-08-31 (×2): 10 mg via ORAL
  Filled 2016-08-29 (×2): qty 2

## 2016-08-29 MED ORDER — ACETAMINOPHEN 325 MG PO TABS: 650.0000 mg | ORAL_TABLET | Freq: Once | ORAL | Status: DC

## 2016-08-29 MED ORDER — DIVALPROEX SODIUM 125 MG PO CSDR
125.0000 mg | DELAYED_RELEASE_CAPSULE | Freq: Two times a day (BID) | ORAL | Status: DC
  Administered 2016-08-30 – 2016-08-31 (×4): 125 mg via ORAL
  Filled 2016-08-29 (×5): qty 1

## 2016-08-29 MED ORDER — TRAZODONE HCL 50 MG PO TABS
50.0000 mg | ORAL_TABLET | Freq: Every day | ORAL | Status: DC
  Administered 2016-08-30 (×2): 50 mg via ORAL
  Filled 2016-08-29 (×2): qty 1

## 2016-08-29 MED ORDER — MAGNESIUM HYDROXIDE 400 MG/5ML PO SUSP: 30.0000 mL | Freq: Every day | ORAL | Status: DC | PRN

## 2016-08-29 MED ORDER — VANCOMYCIN HCL IN DEXTROSE 1-5 GM/200ML-% IV SOLN
1000.0000 mg | INTRAVENOUS | Status: AC
  Administered 2016-08-29: 1000 mg via INTRAVENOUS
  Filled 2016-08-29: qty 200

## 2016-08-29 MED ORDER — AZTREONAM 2 G IJ SOLR
2.0000 g | Freq: Once | INTRAMUSCULAR | Status: AC
  Administered 2016-08-29: 2 g via INTRAVENOUS
  Filled 2016-08-29: qty 2

## 2016-08-29 NOTE — ED Notes (Signed)
Patient transported to X-ray 

## 2016-08-29 NOTE — Progress Notes (Signed)
Pharmacy Antibiotic Note  Luke Montgomery is a 83 y.o. male admitted on 08/29/2016 with pneumonia.  Aztreonam 2gm IV x 1 dose has been given in the ED.  Pharmacy has been consulted for Vancomycin dosing.  Plan:  Vancomycin 1gm IV x 1 dose in the ED followed by 500mg  IV q12h  Follow renal function, cultures  F/U continuation of broad-spectrum ABX upon admission  Height: 5\' 9"  (175.3 cm) Weight: 146 lb (66.2 kg) IBW/kg (Calculated) : 70.7  Temp (24hrs), Avg:99.4 F (37.4 C), Min:98.4 F (36.9 C), Max:100.3 F (37.9 C)   Recent Labs Lab 08/29/16 1745 08/29/16 1756  WBC 9.9  --   CREATININE 0.80  --   LATICACIDVEN  --  0.88    Estimated Creatinine Clearance: 59.8 mL/min (by C-G formula based on SCr of 0.8 mg/dL).    Allergies  Allergen Reactions  . Dilaudid [Hydromorphone Hcl] Other (See Comments)    Out of his mind. Extremely confused.   Marland Kitchen Penicillins Other (See Comments)    Has patient had a PCN reaction causing immediate rash, facial/tongue/throat swelling, SOB or lightheadedness with hypotension: Yes Has patient had a PCN reaction causing severe rash involving mucus membranes or skin necrosis: Unknown Has patient had a PCN reaction that required hospitalization Unknown Has patient had a PCN reaction occurring within the last 10 years: Unknown above answers are "NO", then may proceed with Cephalosporin use.     Antimicrobials this admission: 5/29 vanc >>   5/29 aztreonam x 1   Dose adjustments this admission:    Microbiology results: 5/29 BCx: sent  Thank you for allowing pharmacy to be a part of this patient's care.  Everette Rank, PharmD 08/29/2016 7:39 PM

## 2016-08-29 NOTE — Clinical Social Work Note (Signed)
Clinical Social Work Assessment  Patient Details  Name: Luke Montgomery MRN: [id number] Date of Birth: [birth year]  Date of referral:  08/29/16               Reason for consult:  Facility Placement                Permission sought to share information with:  Family Supports, Chartered certified accountant granted to share information::  Yes, Verbal Permission Granted  Name::        Agency::     Relationship::     Contact Information:     Housing/Transportation Living arrangements for the past 2 months:  [place] ([facility] [place] of Information:  Patient Patient Interpreter Needed:  None Criminal Activity/Legal Involvement Pertinent to Current Situation/Hospitalization:    Significant Relationships:  Adult Children, Spouse Lives with:    Do you feel safe going back to the place where you live?  Yes Need for family participation in patient care:  Yes (Comment) (Pt's daughter Luke Montgomery)  Care giving concerns:  None listed by pt/famil   Social Worker assessment / plan:  CSW spoke with pt's daughter Luke Montgomery at ph: [phone] and confirmed pt's plan to be discharged back to [facility] to live at discharge.  CSW provided active listening, but pt's daughter voiced no imediate concerns.   Pt's daughter gave CSW Dept permission complete FL-2 and send needed information to [facility], via the hub, if needed.  Pt has been living at [facility] prior to being admitted to [facility].  Employment status:  Retired Nurse, adult PT Recommendations:  Not assessed at this time Information / Referral to community resources:     Patient/Family's Response to care:  Patient not alert and oriented.  Patient's daughter agreeable to plan.  Pt's daughter Luke Montgomery supportive and strongly involved in pt.'s care.  Pt.'s daughter pleasant and appreciated CSW intervention.    Patient/Family's Understanding of and  Emotional Response to Diagnosis, Current Treatment, and Prognosis:  Still assessing  Emotional Assessment Appearance:  Appears stated age Attitude/Demeanor/Rapport:    Affect (typically observed):  Unable to Assess Orientation:   (Pt was awake, but unresponsive) Alcohol / Substance use:    Psych involvement (Current and /or in the community):     Discharge Needs  Concerns to be addressed:  No discharge needs identified Readmission within the last 30 days:  No Current discharge risk:  None Barriers to Discharge:  No Barriers Identified   Claudine Mouton, LCSWA 08/29/2016, 9:45 PM

## 2016-08-29 NOTE — ED Provider Notes (Signed)
[facility] DEPT Provider Note   CSN: [id number] Arrival date & time: 08/29/16  1724   History   Chief Complaint Chief Complaint  Patient presents with  . Weakness  . Fever    HPI Luke Montgomery is a 83 y.o. male who presents with fever and weakness. PMH significant for dementia (current resident at [facility]), DVT/PE on Xarelto. Staff noted he has been more lethargic for the past 3 days. Today he had a fever. He was hypoxic to 75% on RA on arrival. He received 500 L NS by EMS. No N/VD per nursing home staff.  LEVEL 5 CAVEAT DUE TO DEMENTIA  HPI  Past Medical History:  Diagnosis Date  . Dementia   . DVT (deep venous thrombosis) ([place])   . Pulmonary embolism [facility])     Patient Active Problem List   Diagnosis Date Noted  . Syncope 04/15/2016  . Dementia with behavioral disturbance 10/12/2015    Past Surgical History:  Procedure Laterality Date  . HERNIA REPAIR    . SMALL INTESTINE SURGERY     secondary to removal of recurrent polyp      Home Medications    Prior to Admission medications   Medication Sig Start Date End Date Taking? Authorizing Provider  acetaminophen (TYLENOL) 500 MG tablet Take 500 mg by mouth every 4 (four) hours as needed for mild pain, moderate pain, fever or headache.    [provider]  alum & mag hydroxide-simeth (MINTOX) 200-200-20 MG/5ML suspension Take 30 mLs by mouth as needed for indigestion or heartburn.    [provider]  amantadine (SYMMETREL) 100 MG capsule Take 100 mg by mouth 2 (two) times daily.    [provider]  busPIRone (BUSPAR) 10 MG tablet Take 10 mg by mouth 2 (two) times daily with a meal.    [provider]  carbamazepine (TEGRETOL) 200 MG tablet Take 200 mg by mouth 2 (two) times daily with a meal.    [provider]  cholecalciferol (VITAMIN D) 1000 units tablet Take 1,000 Units by mouth daily.    [provider]  citalopram (CELEXA) 20 MG tablet Take 20 mg  by mouth daily.    [provider]  divalproex (DEPAKOTE SPRINKLE) 125 MG capsule Take 125 mg by mouth 2 (two) times daily.    [provider]  donepezil (ARICEPT) 10 MG tablet Take 10 mg by mouth at bedtime.    [provider]  guaifenesin (ROBAFEN) 100 MG/5ML syrup Take 200 mg by mouth every 6 (six) hours as needed for cough.    [provider]  loperamide (IMODIUM) 2 MG capsule Take 2 mg by mouth as needed for diarrhea or loose stools.    [provider]  LORazepam (ATIVAN) 0.5 MG tablet Take 0.5 mg by mouth every 8 (eight) hours as needed (for agitation).    [provider]  magnesium hydroxide (MILK OF MAGNESIA) 400 MG/5ML suspension Take 30 mLs by mouth daily as needed for mild constipation.    [provider]  Melatonin 1 MG TABS Take 1 mg by mouth at bedtime.     [provider]  memantine (NAMENDA XR) 14 MG CP24 24 hr capsule Take 14 mg by mouth daily.    [provider]  neomycin-bacitracin-polymyxin (NEOSPORIN) ointment Apply 1 application topically as needed for wound care.    [provider]  OLANZapine (ZYPREXA) 10 MG tablet Take 10 mg by mouth daily with breakfast.    [provider]  risperiDONE (RISPERDAL) 0.5 MG tablet Take 0.5 mg by mouth every 4 (four) hours as needed (for psychosis/agitation).    [provider]  rivaroxaban (XARELTO) 20 MG TABS tablet Take 20 mg by mouth daily with supper.    [provider]  tamsulosin (FLOMAX) 0.4 MG CAPS capsule Take 0.4 mg by mouth at bedtime.    [provider]  traZODone (DESYREL) 50 MG tablet Take 50 mg by mouth at bedtime.    [provider]    Family History Family History  Problem Relation Age of Onset  . Family history unknown: Yes    Social History Social History  Substance Use Topics  . Smoking status: Never Smoker  . Smokeless tobacco: Never Used  . Alcohol use No     Allergies     Dilaudid [hydromorphone hcl] and Penicillins   Review of Systems Review of Systems  Unable to perform ROS: Dementia     Physical Exam Updated Vital Signs BP 137/63 (BP Location: Left Arm)   Pulse 71   Temp 100.3 F (37.9 C) (Rectal)   Resp 16   SpO2 91%   Physical Exam  Constitutional: He appears well-developed and well-nourished. No distress.  HENT:  Head: Normocephalic and atraumatic.  Eyes: Conjunctivae are normal. Pupils are equal, round, and reactive to light. Right eye exhibits no discharge. Left eye exhibits no discharge. No scleral icterus.  Neck: Normal range of motion.  Cardiovascular: Normal rate and regular rhythm.  Exam reveals no gallop and no friction rub.   No murmur heard. Pulmonary/Chest: Effort normal and breath sounds normal. No respiratory distress. He has no wheezes. He has no rales. He exhibits no tenderness.  Abdominal: Soft. Bowel sounds are normal. He exhibits no distension and no mass. There is no tenderness. There is no rebound and no guarding. No hernia.  Neurological: He is alert. He is disoriented. He displays tremor (mild resting upper extremity tremor). GCS eye subscore is 4. GCS verbal subscore is 4. GCS motor subscore is 5.  Skin: Skin is warm and dry.  Psychiatric: He has a normal mood and affect. His behavior is normal.  Nursing note and vitals reviewed.    ED Treatments / Results  Labs (all labs ordered are listed, but only abnormal results are displayed) Labs Reviewed  COMPREHENSIVE METABOLIC PANEL - Abnormal; Notable for the following:       Result Value   Glucose, Bld 113 (*)    Calcium 8.5 (*)    Total Protein 6.1 (*)    Albumin 3.1 (*)    All other components within normal limits  CBC WITH DIFFERENTIAL/PLATELET - Abnormal; Notable for the following:    Neutro Abs 7.9 (*)    All other components within normal limits  URINALYSIS, ROUTINE W REFLEX MICROSCOPIC - Abnormal; Notable for the following:    Color, Urine AMBER (*)     APPearance HAZY (*)    Protein, ur 30 (*)    All other components within normal limits  CULTURE, BLOOD (ROUTINE X 2)  CULTURE, BLOOD (ROUTINE X 2)  I-STAT CG4 LACTIC ACID, ED  I-STAT CG4 LACTIC ACID, ED    EKG  EKG Interpretation  Date/Time:  Tuesday Aug 29 2016 17:38:50 EDT Ventricular Rate:  70 PR Interval:    QRS Duration: 99 QT Interval:  400 QTC Calculation: 432 R Axis:   77 Text Interpretation:  Sinus rhythm since last tracing no significant change Confirmed by Malvin Johns [phone]) on 08/29/2016 6:00:20  PM       Radiology Dg Chest 2 View  Result Date: 08/29/2016 CLINICAL DATA:  Initial evaluation for increased lethargy. EXAM: CHEST  2 VIEW COMPARISON:  Prior radiograph from 05/10/2016. FINDINGS: Stable cardiomegaly. Mediastinal silhouette normal. Aortic atherosclerosis. Lungs hypoinflated. Mild diffuse pulmonary vascular congestion without overt pulmonary edema. No pleural effusion. More confluent patchy opacity within the retrocardiac left lower lobe, which may reflect atelectasis or infiltrate. No other focal airspace disease. No pneumothorax. No acute osseus abnormality. IMPRESSION: 1. Patchy retrocardiac left lower lobe opacity, which may reflect atelectasis or infiltrate. 2. Stable cardiomegaly with mild perihilar vascular congestion without overt pulmonary edema. Electronically Signed   By: Jeannine Boga M.D.   On: 08/29/2016 18:53    Procedures Procedures (including critical care time)  Medications Ordered in ED Medications  vancomycin (VANCOCIN) IVPB 1000 mg/200 mL premix (1,000 mg Intravenous New Bag/Given 08/29/16 2007)  vancomycin (VANCOCIN) 500 mg in sodium chloride 0.9 % 100 mL IVPB (not administered)  sodium chloride 0.9 % bolus 1,000 mL (0 mLs Intravenous Stopped 08/29/16 2002)  acetaminophen (TYLENOL) suppository 650 mg (650 mg Rectal Given 08/29/16 1853)  aztreonam (AZACTAM) 2 g in dextrose 5 % 50 mL IVPB (0 g Intravenous Stopped 08/29/16 2007)      Initial Impression / Assessment and Plan / ED Course  I have reviewed the triage vital signs and the nursing notes.  Pertinent labs & imaging results that were available during my care of the patient were reviewed by me and considered in my medical decision making (see chart for details).  70:80 PM 83 year old male with fever and generalized weakness. He is hypoxic on arrival with improvement on 2L O2. Rectal temp is 100.3. No tachycardia and BP is normal. Will initiate labs, fluids, and infectious work up. No indication for sepsis at this point. Tylenol suppository was ordered.   6:44 PM CBC unremarkable. CMP overall unremarkable. Lactic acid is 0.88. EKG is NSR. UA clean. CXR remarkable for patchy retrocardiac LLL opacity. Will cover for HCAP.   8:23 PM Pt is resting comfortably. He is singing. O2 was taken off and it dropped to 89% therefore O2 was continued. Shared visit with Dr. Colvin Caroli. Will admit for treatment of HCAP.   Final Clinical Impressions(s) / ED Diagnoses   Final diagnoses:  Weakness  HCAP (healthcare-associated pneumonia)    New Prescriptions New Prescriptions   No medications on file     Recardo Evangelist, Hershal Coria 08/30/16 [id number]    Charlesetta Shanks, MD 09/01/16 1444

## 2016-08-29 NOTE — ED Notes (Signed)
Will give report to nurse on the floor. 

## 2016-08-29 NOTE — Progress Notes (Addendum)
Consult request has been received. CSW attempting to follow up at present time. CSW reviewed notes detailing pt's HX of dementia, as well of note stating pt is confused at baseline . CSW attempted to call pt's spouse Brance Dartt at [phone] but phone has no service.  CSW attempted to call pt's daughter Braydn Carneiro and was unable to leave message as VM was not set up. CSW will go to bedside now.  9:34 PM CSW was at bedside, but no family was present.  Per staff who was bedside and per notes, pt was alert X 1 at admission.  CSW introduced himself to pt but pt was not responsive.  CSW was unable to complete assessment at this time.  Per notes, pt was at [facility] prior to admission.  Alphonse Guild. Berenize Gatlin, Latanya Presser, LCAS Clinical Social Worker Ph: [phone]

## 2016-08-29 NOTE — ED Notes (Signed)
Bed: WA20 Expected date:  Expected time:  Means of arrival:  Comments: EMS/ weak 

## 2016-08-29 NOTE — H&P (Signed)
History and Physical    Luke Montgomery TMH:[id number] DOB: 1925/10/26 DOA: 08/29/2016  PCP: Deland Pretty, MD   Patient coming from: SNF  Chief Complaint: Productive cough  HPI: Luke Montgomery is a 83 y.o. gentleman with a history of advanced dementia with behavioral disturbance and DVT/PE (anticoagulated with Xarelto) who was transferred to the ED for evaluation of increased lethargy and new hypoxia.  The patient is unable to give any meaningful history; information obtained from his daughter by phone.  The patient has had productive cough (yellow, nonbloody sputum) and nasal congestion for one week.  Symptoms improved transiently with mucinex.  However, the patient has had progressive lethargy for the past 2-3 days with inappropriate behavior (falling asleep during meals, not feeding himself).  No known ever.  He has had decreased appetite but no nausea or vomiting.   ED Course: Rectal temperature of 100.3.  Normal WBC count.  Normal lactic acid level.  Chest xray concerning for LLL retrocardiac infiltrate (vs atelectasis) with cardiomegaly and perihilar congestion without overt edema.  ED initiated treatment for HCAP; hospitalist asked to admit.  Review of Systems: Unable to obtain due to dementia.   Past Medical History:  Diagnosis Date  . Dementia   . DVT (deep venous thrombosis) ([place])   . Pulmonary embolism [facility])     Past Surgical History:  Procedure Laterality Date  . HERNIA REPAIR    . SMALL INTESTINE SURGERY     secondary to removal of recurrent polyp     reports that he has never smoked. He has never used smokeless tobacco. He reports that he does not drink alcohol or use drugs.  He is a widower.  His only daughter is his POA.  He is an Scientist, research (life sciences).  Allergies  Allergen Reactions  . Dilaudid [Hydromorphone Hcl] Other (See Comments)    Out of his mind. Extremely confused.   Marland Kitchen Penicillins Other (See Comments)    Has patient had a PCN reaction causing immediate rash,  facial/tongue/throat swelling, SOB or lightheadedness with hypotension: Yes Has patient had a PCN reaction causing severe rash involving mucus membranes or skin necrosis: Unknown Has patient had a PCN reaction that required hospitalization Unknown Has patient had a PCN reaction occurring within the last 10 years: Unknown above answers are "NO", then may proceed with Cephalosporin use.     Family History  Problem Relation Age of Onset  . Family history unknown: Yes  Other family members have been affected with dementia.   Prior to Admission medications   Medication Sig Start Date End Date Taking? Authorizing Provider  acetaminophen (TYLENOL) 500 MG tablet Take 500 mg by mouth every 4 (four) hours as needed for mild pain, moderate pain, fever or headache.   Yes [provider]  alum & mag hydroxide-simeth (MINTOX) 200-200-20 MG/5ML suspension Take 30 mLs by mouth as needed for indigestion or heartburn.   Yes [provider]  amantadine (SYMMETREL) 100 MG capsule Take 100 mg by mouth 2 (two) times daily.   Yes [provider]  busPIRone (BUSPAR) 10 MG tablet Take 10 mg by mouth 2 (two) times daily with a meal.   Yes [provider]  carbamazepine (TEGRETOL) 200 MG tablet Take 200 mg by mouth 2 (two) times daily with a meal.   Yes [provider]  cholecalciferol (VITAMIN D) 1000 units tablet Take 1,000 Units by mouth daily.   Yes [provider]  citalopram (CELEXA) 20 MG tablet Take 20 mg by  mouth daily.   Yes [provider]  divalproex (DEPAKOTE SPRINKLE) 125 MG capsule Take 125 mg by mouth 2 (two) times daily.   Yes [provider]  donepezil (ARICEPT) 10 MG tablet Take 10 mg by mouth at bedtime.   Yes [provider]  guaifenesin (ROBAFEN) 100 MG/5ML syrup Take 200 mg by mouth every 6 (six) hours as needed for cough.   Yes [provider]  loperamide (IMODIUM) 2 MG capsule Take 2 mg by mouth as  needed for diarrhea or loose stools.   Yes [provider]  LORazepam (ATIVAN) 0.5 MG tablet Take 0.5 mg by mouth every 8 (eight) hours as needed (for agitation).   Yes [provider]  magnesium hydroxide (MILK OF MAGNESIA) 400 MG/5ML suspension Take 30 mLs by mouth daily as needed for mild constipation.   Yes [provider]  Melatonin 1 MG TABS Take 1 mg by mouth at bedtime.    Yes [provider]  memantine (NAMENDA XR) 14 MG CP24 24 hr capsule Take 14 mg by mouth daily.   Yes [provider]  neomycin-bacitracin-polymyxin (NEOSPORIN) ointment Apply 1 application topically as needed for wound care.   Yes [provider]  OLANZapine (ZYPREXA) 10 MG tablet Take 10 mg by mouth daily with breakfast.   Yes [provider]  risperiDONE (RISPERDAL) 0.5 MG tablet Take 0.5 mg by mouth every 4 (four) hours as needed (for psychosis/agitation).   Yes [provider]  rivaroxaban (XARELTO) 20 MG TABS tablet Take 20 mg by mouth daily with supper.   Yes [provider]  tamsulosin (FLOMAX) 0.4 MG CAPS capsule Take 0.4 mg by mouth at bedtime.   Yes [provider]  traZODone (DESYREL) 50 MG tablet Take 50 mg by mouth at bedtime.   Yes [provider]    Physical Exam: Vitals:   08/29/16 2007 08/29/16 2015 08/29/16 2030 08/29/16 2045  BP:  (!) 148/60 127/66 (!) 145/68  Pulse: 65 63 64 63  Resp: 19 16 18 16   Temp:      TempSrc:      SpO2: 91% 92% 96% 96%  Weight:      Height:          Constitutional: NAD, calm, comfortable, elderly and frail appearing but he is not decompensating Vitals:   08/29/16 2007 08/29/16 2015 08/29/16 2030 08/29/16 2045  BP:  (!) 148/60 127/66 (!) 145/68  Pulse: 65 63 64 63  Resp: 19 16 18 16   Temp:      TempSrc:      SpO2: 91% 92% 96% 96%  Weight:      Height:       Eyes: PERRL, lids and conjunctivae normal ENMT: Mucous membranes are DRY. Posterior pharynx not  visualized.   Neck: normal appearance, supple, no masses Respiratory: Diminished bilaterally, no wheezing, no crackles. Normal respiratory effort. No accessory muscle use.  Cardiovascular: Normal rate, regular rhythm, no murmurs / rubs / gallops. No extremity edema. 2+ pedal pulses.  GI: abdomen is soft and compressible.  No distention.  No tenderness.  Bowel sounds are present. Musculoskeletal:  No joint deformity in upper and lower extremities. Resting tremor.  Moves all four extremities spontaneously.  No contractures.  Normal muscle tone.  Skin: no rashes, pale, cool, dry Neurologic: Limited by underlying dementia; difficulty following commands.  Tongue appears midline, face symmetric.  Hard of hearing.  No apparent focal deficits. Psychiatric: Only oriented to person, which is his baseline.  Became combative when turned.  Judgment and insight impaired due to baseline dementia.    Labs on Admission: I have personally reviewed following labs and imaging studies  CBC:  Recent Labs Lab 08/29/16 1745  WBC 9.9  NEUTROABS 7.9*  HGB 13.4  HCT 41.3  MCV 92.0  PLT 026   Basic Metabolic Panel:  Recent Labs Lab 08/29/16 1745  NA 142  K 4.2  CL 106  CO2 27  GLUCOSE 113*  BUN 16  CREATININE 0.80  CALCIUM 8.5*   GFR: Estimated Creatinine Clearance: 59.8 mL/min (by C-G formula based on SCr of 0.8 mg/dL). Liver Function Tests:  Recent Labs Lab 08/29/16 1745  AST 20  ALT 31  ALKPHOS 89  BILITOT 0.7  PROT 6.1*  ALBUMIN 3.1*   Urine analysis:    Component Value Date/Time   COLORURINE AMBER (A) 08/29/2016 1745   APPEARANCEUR HAZY (A) 08/29/2016 1745   LABSPEC 1.027 08/29/2016 1745   PHURINE 5.0 08/29/2016 1745   GLUCOSEU NEGATIVE 08/29/2016 1745   HGBUR NEGATIVE 08/29/2016 1745   BILIRUBINUR NEGATIVE 08/29/2016 1745   KETONESUR NEGATIVE 08/29/2016 1745   PROTEINUR 30 (A) 08/29/2016 1745   NITRITE NEGATIVE 08/29/2016 1745   LEUKOCYTESUR NEGATIVE 08/29/2016 1745    Sepsis Labs:  Lactic acid level 0.88  Radiological Exams on Admission: Dg Chest 2 View  Result Date: 08/29/2016 CLINICAL DATA:  Initial evaluation for increased lethargy. EXAM: CHEST  2 VIEW COMPARISON:  Prior radiograph from 05/10/2016. FINDINGS: Stable cardiomegaly. Mediastinal silhouette normal. Aortic atherosclerosis. Lungs hypoinflated. Mild diffuse pulmonary vascular congestion without overt pulmonary edema. No pleural effusion. More confluent patchy opacity within the retrocardiac left lower lobe, which may reflect atelectasis or infiltrate. No other focal airspace disease. No pneumothorax. No acute osseus abnormality. IMPRESSION: 1. Patchy retrocardiac left lower lobe opacity, which may reflect atelectasis or infiltrate. 2. Stable cardiomegaly with mild perihilar vascular congestion without overt pulmonary edema. Electronically Signed   By: Jeannine Boga M.D.   On: 08/29/2016 18:53    EKG: Independently reviewed. NSR.  LVH.  Assessment/Plan Principal Problem:   HCAP (healthcare-associated pneumonia) Active Problems:   Dementia with behavioral disturbance   Hx of pulmonary embolus      HCAP without overt sepsis --Continue vanc and aztreonam for now --Blood cultures pending --Sputum culture if he can produce a sample --Urine streptococcal antigen --Aspiration precautions --Turn, cough, deep breath q2h --Guaifenesin prn  Dementia with behavioral disturbance --Amantadine, buspar,celexa, depakote, aricept, namenda, zyprexa, trazodon --Ativan, risperdal prn  BPH --Flomax  History of VTE --Anticoagulated with Xarelto  DVT prophylaxis: Anticoagulated with Xarelto Code Status: DNR Family Communication: Plan of care discussed with his only daughter/POA by phone. Disposition Plan: Expect he will go back to SNF/memory care unit when ready. Consults called: NONE Admission status: Inpatient, telemetry.  I expect this patient will need inpatient services for at least  two midnights.   TIME SPENT: 60 minutes   Eber Jones MD [facility] Pager [phone]  If 7PM-7AM, please contact night-coverage [email or web address] Password [facility]  08/29/2016, 9:28 PM

## 2016-08-29 NOTE — ED Notes (Signed)
ED Provider at bedside. 

## 2016-08-29 NOTE — ED Triage Notes (Signed)
Per EMS, from [facility], staff reports patient has had increased lethargy x3 days. Alert and oriented x1. Hx dementia. Confused at baseline. Staff denies N/V/D. 16g L FA. 589ml with EMS  BP 132/80 CBG 164 HR 80 O2 90% RR 18

## 2016-08-30 DIAGNOSIS — F0391 Unspecified dementia with behavioral disturbance: Secondary | ICD-10-CM

## 2016-08-30 DIAGNOSIS — J189 Pneumonia, unspecified organism: Principal | ICD-10-CM

## 2016-08-30 DIAGNOSIS — Z86711 Personal history of pulmonary embolism: Secondary | ICD-10-CM

## 2016-08-30 LAB — BASIC METABOLIC PANEL
ANION GAP: 7 (ref 5–15)
BUN: 14 mg/dL (ref 6–20)
CHLORIDE: 107 mmol/L (ref 101–111)
CO2: 27 mmol/L (ref 22–32)
CREATININE: 0.71 mg/dL (ref 0.61–1.24)
Calcium: 8.5 mg/dL — ABNORMAL LOW (ref 8.9–10.3)
GFR calc non Af Amer: >60 mL/min (ref >60)
Glucose, Bld: 110 mg/dL — ABNORMAL HIGH (ref 65–99)
POTASSIUM: 3.8 mmol/L (ref 3.5–5.1)
Sodium: 141 mmol/L (ref 135–145)

## 2016-08-30 LAB — STREP PNEUMONIAE URINARY ANTIGEN: STREP PNEUMO URINARY ANTIGEN: NEGATIVE

## 2016-08-30 LAB — CBC
HCT: 37.1 % — ABNORMAL LOW (ref 39.0–52.0)
HEMOGLOBIN: 12.2 g/dL — AB (ref 13.0–17.0)
MCH: 30.2 pg (ref 26.0–34.0)
MCHC: 32.9 g/dL (ref 30.0–36.0)
MCV: 91.8 fL (ref 78.0–100.0)
Platelets: 169 10*3/uL (ref 150–400)
RBC: 4.04 MIL/uL — AB (ref 4.22–5.81)
RDW: 13.3 % (ref 11.5–15.5)
WBC: 9.1 10*3/uL (ref 4.0–10.5)

## 2016-08-30 LAB — PROCALCITONIN

## 2016-08-30 LAB — MRSA PCR SCREENING: MRSA BY PCR: NEGATIVE

## 2016-08-30 MED ORDER — DOXYCYCLINE HYCLATE 100 MG PO TABS
100.0000 mg | ORAL_TABLET | Freq: Two times a day (BID) | ORAL | Status: DC
  Administered 2016-08-30 – 2016-08-31 (×2): 100 mg via ORAL
  Filled 2016-08-30 (×2): qty 1

## 2016-08-30 MED ORDER — AZTREONAM IN DEXTROSE 2 GM/50ML IV SOLN
2.0000 g | Freq: Three times a day (TID) | INTRAVENOUS | Status: DC
  Administered 2016-08-30 (×2): 2 g via INTRAVENOUS
  Filled 2016-08-30 (×3): qty 50

## 2016-08-30 MED ORDER — PNEUMOCOCCAL VAC POLYVALENT 25 MCG/0.5ML IJ INJ
0.5000 mL | INJECTION | INTRAMUSCULAR | Status: AC
  Administered 2016-08-31: 0.5 mL via INTRAMUSCULAR
  Filled 2016-08-30: qty 0.5

## 2016-08-30 MED ORDER — ENSURE ENLIVE PO LIQD
237.0000 mL | Freq: Two times a day (BID) | ORAL | Status: DC
  Administered 2016-08-30 – 2016-08-31 (×2): 237 mL via ORAL

## 2016-08-30 NOTE — Progress Notes (Signed)
CSW following to for DC planning as pt is admitted from [facility] memory care unit. Made contact with Stephan Minister (PM shift CSW spoke with pt and family previously) and confirmed pt long-term resident there. At baseline can transfer independently but does not ambulate, staff assists pt with bathing, meals, ADLs. States pt has been doing well at facility and can return when medically stable.  Facility will need updated FL2 at DC.  CSW will continue following in order to assist with DC back to ALF.   Sharren Bridge, MSW, LCSW Clinical Social Work 08/30/2016 [phone]

## 2016-08-30 NOTE — Progress Notes (Signed)
Initial Nutrition Assessment  INTERVENTION:   -Continue Ensure Enlive po BID, each supplement provides 350 kcal and 20 grams of protein -Provide Magic cup TID with meals, each supplement provides 290 kcal and 9 grams of protein -Will place order of ground meats in diet order -RD to continue to monitor for needs  NUTRITION DIAGNOSIS:   Inadequate oral intake related to lethargy/confusion (dementia) as evidenced by meal completion < 50%.  GOAL:   Patient will meet greater than or equal to 90% of their needs  MONITOR:   PO intake, Supplement acceptance, Labs, Weight trends, I & O's  REASON FOR ASSESSMENT:   Malnutrition Screening Tool    ASSESSMENT:   83 y.o. gentleman with a history of advanced dementia with behavioral disturbance and DVT/PE (anticoagulated with Xarelto) who was transferred to the ED for evaluation of increased lethargy and new hypoxia.  The patient is unable to give any meaningful history; information obtained from his daughter by phone.  The patient has had productive cough (yellow, nonbloody sputum) and nasal congestion for one week.  Symptoms improved transiently with mucinex.  However, the patient has had progressive lethargy for the past 2-3 days with inappropriate behavior (falling asleep during meals, not feeding himself).  No known ever.  He has had decreased appetite but no nausea or vomiting.   Patient with no family at bedside at time of visit. Pt with dementia from memory care unit. Per chart review, pt has had decreased intakes recently with incidences of falling asleep at meal times and not being able to feed himself. Pt currently consuming 50% of meals. Noted that patient needs ground meats with diet, placed in diet order.  Pt is receiving Ensure supplements, placed additional order of magic cups on meals.   Per chart review, pt has lost 14 lb since 12/21/15 (9% wt loss x 8 months, insignificant for time frame).  Unable to complete Nutrition-Focused  physical exam at this time given note of patient's combativeness when moved.   Labs reviewed. Medications revieed.   Diet Order:  Diet regular Room service appropriate? Yes; Fluid consistency: Thin  Skin:  Reviewed, no issues  Last BM:   PTA  Height:   Ht Readings from Last 1 Encounters:  08/29/16 5\' 9"  (1.753 m)    Weight:   Wt Readings from Last 1 Encounters:  08/29/16 146 lb (66.2 kg)    Ideal Body Weight:  72.7 kg  BMI:  Body mass index is 21.56 kg/m.  Estimated Nutritional Needs:   Kcal:  1650-1850  Protein:  70-80g  Fluid:  1.8L/day  EDUCATION NEEDS:   No education needs identified at this time  Clayton Bibles, MS, RD, LDN Pager: [phone] After Hours Pager: [phone]

## 2016-08-30 NOTE — Progress Notes (Signed)
PROGRESS NOTE [facility]   Luke Montgomery   OVZ:[id number] DOB: May 27, 1925  DOA: 08/29/2016 PCP: Deland Pretty, MD   Brief Narrative:  83 year old gentleman with medical history of advanced dementia with behavioral disturbance, DVT and PE on Xarelto was transferred to the ED for evaluation of increasing lethargy and hypoxia. Per history patient had productive cough and nasal congestion for 1 week which improved with Mucinex although had progressive lethargy for 2-3 days prior to admission. In the ED was found to have temperature of 100.3 with normal white count and chest x-ray concerning of lower left lower infiltrate patient was admitted for treatment of hospital-acquired pneumonia and was placed on antibiotics.  Subjective: Patient seen and examined, not contributing to history due to dementia. Per nursing staff no acute event overnight.  Assessment & Plan: HCAP  Initially treated with vancomycin and Aztreonam - switched to Doxycycline  Will obtain procalcitonin  Follow up blood cx  Supportive treatment as needed  Continue to monitor  Check labs in AM   Severe Dementia  Continue current home regimen  Ativan PRN  Risperdal prn patient high risk of sundowning   HX of DVT  A/c w Laren Everts will continue for now   DVT prophylaxis: Xarelto Code Status: DO NOT RESUSCITATE Family Communication: None at bedside, attempted to call daughter and wife 2 with no answer Disposition Plan: Back to SNF when medically stable and likely in 24 hour  Consultants:   None  Procedures:   None  Antimicrobials: Anti-infectives    Start     Dose/Rate Route Frequency Ordered Stop   08/30/16 2200  doxycycline (VIBRA-TABS) tablet 100 mg     100 mg Oral Every 12 hours 08/30/16 1751     08/30/16 0900  vancomycin (VANCOCIN) 500 mg in sodium chloride 0.9 % 100 mL IVPB  Status:  Discontinued     500 mg 100 mL/hr over 60 Minutes Intravenous Every 12 hours 08/29/16 1947 08/30/16 1751   08/30/16 0400  aztreonam (AZACTAM) 2 GM IVPB  Status:  Discontinued     2 g 100 mL/hr over 30 Minutes Intravenous Every 8 hours 08/30/16 0213 08/30/16 1751   08/29/16 1945  vancomycin (VANCOCIN) IVPB 1000 mg/200 mL premix     1,000 mg 200 mL/hr over 60 Minutes Intravenous STAT 08/29/16 1939 08/29/16 2125   08/29/16 1930  aztreonam (AZACTAM) 2 g in dextrose 5 % 50 mL IVPB     2 g 100 mL/hr over 30 Minutes Intravenous  Once 08/29/16 1923 08/29/16 2007       Objective: Vitals:   08/29/16 2234 08/30/16 0529 08/30/16 0600 08/30/16 1523  BP: (!) 166/72 (!) 167/63 (!) 156/70 (!) 142/84  Pulse: 70 66  77  Resp:  16  (!) 25  Temp: 98 F (36.7 C) 98.6 F (37 C)  98.5 F (36.9 C)  TempSrc: Oral Oral  Tympanic  SpO2: 100% 98%  93%  Weight:      Height:        Intake/Output Summary (Last 24 hours) at 08/30/16 1800 Last data filed at 08/30/16 1614  Gross per 24 hour  Intake             1760 ml  Output                0 ml  Net             1760 ml   Filed Weights   08/29/16 1804  Weight: 66.2 kg (146  lb)    Examination:  General exam: Appears calm and comfortable  Respiratory system: Breath sound diminished b/l, no crackles or wheezing  Cardiovascular system: S1 & S2 heard, RRR. No JVD, murmurs Gastrointestinal system: Abdomen is nondistended, soft and nontender.  Central nervous system: Confuse, oriented to person only Extremities: No pedal edema.   Skin: No rashes, lesions or ulcers.   Data Reviewed: I have personally reviewed following labs and imaging studies  CBC:  Recent Labs Lab 08/29/16 1745 08/30/16 0542  WBC 9.9 9.1  NEUTROABS 7.9*  --   HGB 13.4 12.2*  HCT 41.3 37.1*  MCV 92.0 91.8  PLT 203 947   Basic Metabolic Panel:  Recent Labs Lab 08/29/16 1745 08/30/16 0542  NA 142 141  K 4.2 3.8  CL 106 107  CO2 27 27  GLUCOSE 113* 110*  BUN 16 14  CREATININE 0.80 0.71  CALCIUM 8.5* 8.5*   GFR: Estimated Creatinine Clearance: 59.8 mL/min (by C-G  formula based on SCr of 0.71 mg/dL). Liver Function Tests:  Recent Labs Lab 08/29/16 1745  AST 20  ALT 31  ALKPHOS 89  BILITOT 0.7  PROT 6.1*  ALBUMIN 3.1*   Sepsis Labs:  Recent Labs Lab 08/29/16 1756 08/29/16 2141 08/30/16 0542  PROCALCITON  --   --  <0.10  LATICACIDVEN 0.88 0.72  --     Recent Results (from the past 240 hour(s))  Blood Culture (routine x 2)     Status: None (Preliminary result)   Collection Time: 08/29/16  5:49 PM  Result Value Ref Range Status   Specimen Description BLOOD LEFT FOREARM  Final   Special Requests   Final    BOTTLES DRAWN AEROBIC AND ANAEROBIC Blood Culture adequate volume   Culture   Final    NO GROWTH < 24 HOURS Performed at [facility], [place] [address], [place], [place] [address]    Report Status PENDING  Incomplete  MRSA PCR Screening     Status: None   Collection Time: 08/30/16  6:20 AM  Result Value Ref Range Status   MRSA by PCR NEGATIVE NEGATIVE Final    Comment:        The GeneXpert MRSA Assay (FDA approved for NASAL specimens only), is one component of a comprehensive MRSA colonization surveillance program. It is not intended to diagnose MRSA infection nor to guide or monitor treatment for MRSA infections.      Radiology Studies: Dg Chest 2 View  Result Date: 08/29/2016 CLINICAL DATA:  Initial evaluation for increased lethargy. EXAM: CHEST  2 VIEW COMPARISON:  Prior radiograph from 05/10/2016. FINDINGS: Stable cardiomegaly. Mediastinal silhouette normal. Aortic atherosclerosis. Lungs hypoinflated. Mild diffuse pulmonary vascular congestion without overt pulmonary edema. No pleural effusion. More confluent patchy opacity within the retrocardiac left lower lobe, which may reflect atelectasis or infiltrate. No other focal airspace disease. No pneumothorax. No acute osseus abnormality. IMPRESSION: 1. Patchy retrocardiac left lower lobe opacity, which may reflect atelectasis or infiltrate. 2. Stable cardiomegaly with  mild perihilar vascular congestion without overt pulmonary edema. Electronically Signed   By: Jeannine Boga M.D.   On: 08/29/2016 18:53    Scheduled Meds: . amantadine  100 mg Oral BID  . busPIRone  10 mg Oral BID WC  . carbamazepine  200 mg Oral BID WC  . citalopram  20 mg Oral Daily  . divalproex  125 mg Oral BID  . donepezil  10 mg Oral QHS  . doxycycline  100 mg Oral Q12H  .  feeding supplement (ENSURE ENLIVE)  237 mL Oral BID BM  . memantine  14 mg Oral Daily  . OLANZapine  10 mg Oral Q breakfast  . [START ON 08/31/2016] pneumococcal 23 valent vaccine  0.5 mL Intramuscular Tomorrow-1000  . rivaroxaban  20 mg Oral Q supper  . tamsulosin  0.4 mg Oral QHS  . traZODone  50 mg Oral QHS   Continuous Infusions:    LOS: 1 day   Chipper Oman, MD Pager: Text Page via [email or web address]  [phone]  If 7PM-7AM, please contact night-coverage [email or web address] Password [email or web address] 08/30/2016, 6:00 PM

## 2016-08-31 LAB — CBC WITH DIFFERENTIAL/PLATELET
Basophils Absolute: 0 10*3/uL (ref 0.0–0.1)
Basophils Relative: 0 %
EOS PCT: 1 %
Eosinophils Absolute: 0.1 10*3/uL (ref 0.0–0.7)
HCT: 37.7 % — ABNORMAL LOW (ref 39.0–52.0)
Hemoglobin: 12.4 g/dL — ABNORMAL LOW (ref 13.0–17.0)
LYMPHS ABS: 1.1 10*3/uL (ref 0.7–4.0)
LYMPHS PCT: 11 %
MCH: 30.1 pg (ref 26.0–34.0)
MCHC: 32.9 g/dL (ref 30.0–36.0)
MCV: 91.5 fL (ref 78.0–100.0)
MONO ABS: 1.1 10*3/uL — AB (ref 0.1–1.0)
MONOS PCT: 10 %
Neutro Abs: 8 10*3/uL — ABNORMAL HIGH (ref 1.7–7.7)
Neutrophils Relative %: 78 %
PLATELETS: 195 10*3/uL (ref 150–400)
RBC: 4.12 MIL/uL — AB (ref 4.22–5.81)
RDW: 13.1 % (ref 11.5–15.5)
WBC: 10.3 10*3/uL (ref 4.0–10.5)

## 2016-08-31 MED ORDER — DOXYCYCLINE MONOHYDRATE 100 MG PO TABS: 100.0000 mg | ORAL_TABLET | Freq: Two times a day (BID) | ORAL | 0 refills | Status: AC

## 2016-08-31 MED ORDER — ENSURE ENLIVE PO LIQD: 237.0000 mL | Freq: Two times a day (BID) | ORAL | 12 refills | Status: DC

## 2016-08-31 NOTE — Discharge Summary (Addendum)
Physician Discharge Summary  Luke Montgomery  KGY:[id number]  DOB: Nov 15, 1925  DOA: 08/29/2016 PCP: Deland Pretty, MD  Admit date: 08/29/2016 Discharge date: 08/31/2016  Admitted From: SNF Disposition:  SNF   Recommendations for Outpatient Follow-up:  1. Follow up with PCP in 1-2 weeks 2. Please obtain CBC in one week to monitor Hgb  3. Repeat CXR in 3-4 weeks to assure resolution of xray findings   Discharge Condition: Stable  CODE STATUS: DNR  Diet recommendation: Dysphagia   Brief/Interim Summary: Luke Montgomery is a 83 year old gentleman with medical history of advanced dementia with behavioral disturbance, DVT and PE on Xarelto was transferred to the ED for evaluation of increasing lethargy and hypoxia. Per history patient had productive cough and nasal congestion for 1 week which slight improved with Mucinex although had progressive lethargy for 2-3 days prior to admission. In the ED was found to have temperature of 100.3 with normal white count and chest x-ray concerning of lower left lower infiltrate patient was admitted for treatment of hospital-acquired pneumonia and was placed on antibiotics  Subjective: Patient seen and examined on the day of discharge, he is very responsive although confuse due to his dementia. Patient have no complaints today. Per nursing staff, patient tolerating diet well. Remains afebrile.  Discharge Diagnoses/Hospital Course:  HCAP  Initially treated with vancomycin and Aztreonam - switched to Doxycycline  Pro-calcitonin <0.10 will continue abx for completion purposes  Follow up blood cx no growth thus far, MRSA screening negative   Will d/c on Robitussin DM for cough  Follow up with PCP in 1 week  Repeat CXR in 3-4 weeks  Severe Dementia  No changes in medication were made  Continue home medications, although we recommend to reconsider the use of carbamazepine, as is not advise concomitant use with Xarelto. Please address this with PCP.   HX of  DVT  Xarelto was continued during hospital stay.   All other chronic medical condition were stable during the hospitalization.  On the day of the discharge the patient's vitals were stable, and no other acute medical condition were reported by patient. Patient was felt safe to be discharge to SNF.   Discharge Instructions  You were cared for by a hospitalist during your hospital stay. If you have any questions about your discharge medications or the care you received while you were in the hospital after you are discharged, you can call the unit and asked to speak with the hospitalist on call if the hospitalist that took care of you is not available. Once you are discharged, your primary care physician will handle any further medical issues. Please note that NO REFILLS for any discharge medications will be authorized once you are discharged, as it is imperative that you return to your primary care physician (or establish a relationship with a primary care physician if you do not have one) for your aftercare needs so that they can reassess your need for medications and monitor your lab values.  Discharge Instructions    Call MD for:  difficulty breathing, headache or visual disturbances    Complete by:  As directed    Call MD for:  extreme fatigue    Complete by:  As directed    Call MD for:  hives    Complete by:  As directed    Call MD for:  persistant dizziness or light-headedness    Complete by:  As directed    Call MD for:  persistant nausea and vomiting  Complete by:  As directed    Call MD for:  redness, tenderness, or signs of infection (pain, swelling, redness, odor or green/yellow discharge around incision site)    Complete by:  As directed    Call MD for:  severe uncontrolled pain    Complete by:  As directed    Call MD for:  temperature >100.4    Complete by:  As directed    Diet - low sodium heart healthy    Complete by:  As directed    Increase activity slowly    Complete  by:  As directed      Allergies as of 08/31/2016      Reactions   Dilaudid [hydromorphone Hcl] Other (See Comments)   Out of his mind. Extremely confused.    Penicillins Other (See Comments)   Has patient had a PCN reaction causing immediate rash, facial/tongue/throat swelling, SOB or lightheadedness with hypotension: Yes Has patient had a PCN reaction causing severe rash involving mucus membranes or skin necrosis: Unknown Has patient had a PCN reaction that required hospitalization Unknown Has patient had a PCN reaction occurring within the last 10 years: Unknown above answers are "NO", then may proceed with Cephalosporin use.      Medication List    TAKE these medications   acetaminophen 500 MG tablet Commonly known as:  TYLENOL Take 500 mg by mouth every 4 (four) hours as needed for mild pain, moderate pain, fever or headache.   amantadine 100 MG capsule Commonly known as:  SYMMETREL Take 100 mg by mouth 2 (two) times daily.   busPIRone 10 MG tablet Commonly known as:  BUSPAR Take 10 mg by mouth 2 (two) times daily with a meal.   carbamazepine 200 MG tablet Commonly known as:  TEGRETOL Take 200 mg by mouth 2 (two) times daily with a meal.   cholecalciferol 1000 units tablet Commonly known as:  VITAMIN D Take 1,000 Units by mouth daily.   citalopram 20 MG tablet Commonly known as:  CELEXA Take 20 mg by mouth daily.   divalproex 125 MG capsule Commonly known as:  DEPAKOTE SPRINKLE Take 125 mg by mouth 2 (two) times daily.   donepezil 10 MG tablet Commonly known as:  ARICEPT Take 10 mg by mouth at bedtime.   doxycycline 100 MG tablet Commonly known as:  ADOXA Take 1 tablet (100 mg total) by mouth 2 (two) times daily.   loperamide 2 MG capsule Commonly known as:  IMODIUM Take 2 mg by mouth as needed for diarrhea or loose stools.   LORazepam 0.5 MG tablet Commonly known as:  ATIVAN Take 0.5 mg by mouth every 8 (eight) hours as needed (for agitation).    magnesium hydroxide 400 MG/5ML suspension Commonly known as:  MILK OF MAGNESIA Take 30 mLs by mouth daily as needed for mild constipation.   Melatonin 1 MG Tabs Take 1 mg by mouth at bedtime.   MINTOX 200-200-20 MG/5ML suspension Generic drug:  alum & mag hydroxide-simeth Take 30 mLs by mouth as needed for indigestion or heartburn.   NAMENDA XR 14 MG Cp24 24 hr capsule Generic drug:  memantine Take 14 mg by mouth daily.   neomycin-bacitracin-polymyxin ointment Commonly known as:  NEOSPORIN Apply 1 application topically as needed for wound care.   OLANZapine 10 MG tablet Commonly known as:  ZYPREXA Take 10 mg by mouth daily with breakfast.   risperiDONE 0.5 MG tablet Commonly known as:  RISPERDAL Take 0.5 mg by mouth every 4 (  four) hours as needed (for psychosis/agitation).   rivaroxaban 20 MG Tabs tablet Commonly known as:  XARELTO Take 20 mg by mouth daily with supper.   ROBAFEN 100 MG/5ML syrup Generic drug:  guaifenesin Take 200 mg by mouth every 6 (six) hours as needed for cough.   tamsulosin 0.4 MG Caps capsule Commonly known as:  FLOMAX Take 0.4 mg by mouth at bedtime.   traZODone 50 MG tablet Commonly known as:  DESYREL Take 50 mg by mouth at bedtime.      Contact information for after-discharge care    Destination    [facility] .   Specialty:  Assisted Living Facility Contact information: [address] [place] [phone]             Allergies  Allergen Reactions  . Dilaudid [Hydromorphone Hcl] Other (See Comments)    Out of his mind. Extremely confused.   Marland Kitchen Penicillins Other (See Comments)    Has patient had a PCN reaction causing immediate rash, facial/tongue/throat swelling, SOB or lightheadedness with hypotension: Yes Has patient had a PCN reaction causing severe rash involving mucus membranes or skin necrosis: Unknown Has patient had a PCN reaction that required hospitalization Unknown Has  patient had a PCN reaction occurring within the last 10 years: Unknown above answers are "NO", then may proceed with Cephalosporin use.     Consultations:  None   Procedures/Studies: Dg Chest 2 View  Result Date: 08/29/2016 CLINICAL DATA:  Initial evaluation for increased lethargy. EXAM: CHEST  2 VIEW COMPARISON:  Prior radiograph from 05/10/2016. FINDINGS: Stable cardiomegaly. Mediastinal silhouette normal. Aortic atherosclerosis. Lungs hypoinflated. Mild diffuse pulmonary vascular congestion without overt pulmonary edema. No pleural effusion. More confluent patchy opacity within the retrocardiac left lower lobe, which may reflect atelectasis or infiltrate. No other focal airspace disease. No pneumothorax. No acute osseus abnormality. IMPRESSION: 1. Patchy retrocardiac left lower lobe opacity, which may reflect atelectasis or infiltrate. 2. Stable cardiomegaly with mild perihilar vascular congestion without overt pulmonary edema. Electronically Signed   By: Jeannine Boga M.D.   On: 08/29/2016 18:53    Discharge Exam: Vitals:   08/31/16 0536 08/31/16 1500  BP: (!) 131/46 (!) 168/63  Pulse: 66 78  Resp: 18 (!) 30  Temp: 99.1 F (37.3 C) 98 F (36.7 C)   Vitals:   08/30/16 1523 08/30/16 2214 08/31/16 0536 08/31/16 1500  BP: (!) 142/84 (!) 152/75 (!) 131/46 (!) 168/63  Pulse: 77 75 66 78  Resp: (!) 25 18 18  (!) 30  Temp: 98.5 F (36.9 C) 98.7 F (37.1 C) 99.1 F (37.3 C) 98 F (36.7 C)  TempSrc: Tympanic Axillary Oral Axillary  SpO2: 93% 91% 95% 99%  Weight:      Height:        General: Pt is alert, awake, not in acute distress Cardiovascular: RRR, S1/S2 +, no rubs, no gallops Respiratory: Air entry improved from yesterday  Abdominal: Soft, NT, ND, bowel sounds + Extremities: no edema, no cyanosis  The results of significant diagnostics from this hospitalization (including imaging, microbiology, ancillary and laboratory) are listed below for reference.      Microbiology: Recent Results (from the past 240 hour(s))  Blood Culture (routine x 2)     Status: None (Preliminary result)   Collection Time: 08/29/16  5:49 PM  Result Value Ref Range Status   Specimen Description BLOOD LEFT FOREARM  Final   Special Requests   Final    BOTTLES DRAWN AEROBIC AND ANAEROBIC  Blood Culture adequate volume   Culture   Final    NO GROWTH 2 DAYS Performed at [facility], [place] [address], [place], [place] [address]    Report Status PENDING  Incomplete  Blood Culture (routine x 2)     Status: None (Preliminary result)   Collection Time: 08/29/16 10:58 PM  Result Value Ref Range Status   Specimen Description BLOOD RIGHT ANTECUBITAL  Final   Special Requests   Final    BOTTLES DRAWN AEROBIC AND ANAEROBIC Blood Culture adequate volume   Culture   Final    NO GROWTH 1 DAY Performed at [facility], [place] [address], [place], [place] [address]    Report Status PENDING  Incomplete  MRSA PCR Screening     Status: None   Collection Time: 08/30/16  6:20 AM  Result Value Ref Range Status   MRSA by PCR NEGATIVE NEGATIVE Final    Comment:        The GeneXpert MRSA Assay (FDA approved for NASAL specimens only), is one component of a comprehensive MRSA colonization surveillance program. It is not intended to diagnose MRSA infection nor to guide or monitor treatment for MRSA infections.      Labs: Basic Metabolic Panel:  Recent Labs Lab 08/29/16 1745 08/30/16 0542  NA 142 141  K 4.2 3.8  CL 106 107  CO2 27 27  GLUCOSE 113* 110*  BUN 16 14  CREATININE 0.80 0.71  CALCIUM 8.5* 8.5*   Liver Function Tests:  Recent Labs Lab 08/29/16 1745  AST 20  ALT 31  ALKPHOS 89  BILITOT 0.7  PROT 6.1*  ALBUMIN 3.1*   CBC:  Recent Labs Lab 08/29/16 1745 08/30/16 0542 08/31/16 0544  WBC 9.9 9.1 10.3  NEUTROABS 7.9*  --  8.0*  HGB 13.4 12.2* 12.4*  HCT 41.3 37.1* 37.7*  MCV 92.0 91.8 91.5  PLT 203 169 195   Urinalysis     Component Value Date/Time   COLORURINE AMBER (A) 08/29/2016 1745   APPEARANCEUR HAZY (A) 08/29/2016 1745   LABSPEC 1.027 08/29/2016 1745   PHURINE 5.0 08/29/2016 1745   GLUCOSEU NEGATIVE 08/29/2016 1745   HGBUR NEGATIVE 08/29/2016 1745   [place] 08/29/2016 1745   KETONESUR NEGATIVE 08/29/2016 1745   PROTEINUR 30 (A) 08/29/2016 1745   NITRITE NEGATIVE 08/29/2016 1745   LEUKOCYTESUR NEGATIVE 08/29/2016 1745   Sepsis Labs Invalid input(s): PROCALCITONIN,  WBC,  LACTICIDVEN Microbiology Recent Results (from the past 240 hour(s))  Blood Culture (routine x 2)     Status: None (Preliminary result)   Collection Time: 08/29/16  5:49 PM  Result Value Ref Range Status   Specimen Description BLOOD LEFT FOREARM  Final   Special Requests   Final    BOTTLES DRAWN AEROBIC AND ANAEROBIC Blood Culture adequate volume   Culture   Final    NO GROWTH 2 DAYS Performed at [facility], [place] [address], [place], [place] [address]    Report Status PENDING  Incomplete  Blood Culture (routine x 2)     Status: None (Preliminary result)   Collection Time: 08/29/16 10:58 PM  Result Value Ref Range Status   Specimen Description BLOOD RIGHT ANTECUBITAL  Final   Special Requests   Final    BOTTLES DRAWN AEROBIC AND ANAEROBIC Blood Culture adequate volume   Culture   Final    NO GROWTH 1 DAY Performed at [facility], [place] [address], [place], [place] [address]  Report Status PENDING  Incomplete  MRSA PCR Screening     Status: None   Collection Time: 08/30/16  6:20 AM  Result Value Ref Range Status   MRSA by PCR NEGATIVE NEGATIVE Final    Comment:        The GeneXpert MRSA Assay (FDA approved for NASAL specimens only), is one component of a comprehensive MRSA colonization surveillance program. It is not intended to diagnose MRSA infection nor to guide or monitor treatment for MRSA infections.     Time coordinating discharge:  30 minutes  SIGNED:  Chipper Oman,  MD  [facility] 08/31/2016, 4:04 PM  Pager please text page via  [email or web address] Password [email or web address]

## 2016-08-31 NOTE — NC FL2 (Signed)
[place] LEVEL OF CARE SCREENING TOOL     IDENTIFICATION  Patient Name: Luke Montgomery Birthdate: 1925/08/15 Sex: male Admission Date (Current Location): 08/29/2016  [facility] and [place] Number:  Herbalist and Address:  [facility],  [place] [address] [place]      Provider Number: [id number]  Attending Physician Name and Address:  Patrecia Pour, Christean Grief, MD  Relative Name and Phone Number:       Current Level of Care: Hospital Recommended Level of Care: [place] Prior Approval Number:    Date Approved/Denied:   PASRR Number:    Discharge Plan:  (ALF)  Memory care unit    Current Diagnoses: Patient Active Problem List   Diagnosis Date Noted  . HCAP (healthcare-associated pneumonia) 08/29/2016  . Hx of pulmonary embolus 08/29/2016  . Syncope 04/15/2016  . Dementia with behavioral disturbance 10/12/2015    Orientation RESPIRATION BLADDER Height & Weight     Self  normal Incontinent Weight: 146 lb (66.2 kg) Height:  5\' 9"  (175.3 cm)  BEHAVIORAL SYMPTOMS/MOOD NEUROLOGICAL BOWEL NUTRITION STATUS      Incontinent Diet (mechanical with ground meat)  AMBULATORY STATUS COMMUNICATION OF NEEDS Skin   Extensive Assist Verbally Normal                       Personal Care Assistance Level of Assistance  Bathing, Feeding, Dressing Bathing Assistance: Maximum assistance Feeding assistance: Limited assistance Dressing Assistance: Maximum assistance     Functional Limitations Info  Sight, Hearing, Speech Sight Info: Adequate Hearing Info: Adequate Speech Info: Adequate    SPECIAL CARE FACTORS FREQUENCY                       Contractures Contractures Info: Not present    Additional Factors Info  Code Status, Allergies (in need of secured unit) Code Status Info: DNR Allergies Info: Dilaudid Hydromorphone Hcl, Penicillins           Current Medications (08/31/2016):  This is the current  hospital active medication list Current Facility-Administered Medications  Medication Dose Route Frequency Provider Last Rate Last Dose  . amantadine (SYMMETREL) capsule 100 mg  100 mg Oral BID Lily Kocher, MD   100 mg at 08/31/16 0948  . busPIRone (BUSPAR) tablet 10 mg  10 mg Oral BID WC Lily Kocher, MD   10 mg at 08/31/16 0944  . carbamazepine (TEGRETOL) tablet 200 mg  200 mg Oral BID WC Lily Kocher, MD   200 mg at 08/31/16 0944  . citalopram (CELEXA) tablet 20 mg  20 mg Oral Daily Lily Kocher, MD   20 mg at 08/31/16 0945  . divalproex (DEPAKOTE SPRINKLE) capsule 125 mg  125 mg Oral BID Lily Kocher, MD   125 mg at 08/31/16 0944  . donepezil (ARICEPT) tablet 10 mg  10 mg Oral QHS Lily Kocher, MD   10 mg at 08/30/16 2245  . doxycycline (VIBRA-TABS) tablet 100 mg  100 mg Oral Q12H Patrecia Pour, Christean Grief, MD   100 mg at 08/31/16 0945  . feeding supplement (ENSURE ENLIVE) (ENSURE ENLIVE) liquid 237 mL  237 mL Oral BID BM Lily Kocher, MD   237 mL at 08/31/16 0943  . guaifenesin (ROBITUSSIN) 100 MG/5ML syrup 200 mg  200 mg Oral Q6H PRN Lily Kocher, MD      . LORazepam (ATIVAN) tablet 0.5 mg  0.5 mg Oral Q8H PRN Lily Kocher, MD      .  magnesium hydroxide (MILK OF MAGNESIA) suspension 30 mL  30 mL Oral Daily PRN Lily Kocher, MD      . memantine (NAMENDA XR) 24 hr capsule 14 mg  14 mg Oral Daily Lily Kocher, MD   14 mg at 08/31/16 0944  . OLANZapine (ZYPREXA) tablet 10 mg  10 mg Oral Q breakfast Lily Kocher, MD   10 mg at 08/31/16 0945  . pneumococcal 23 valent vaccine (PNU-IMMUNE) injection 0.5 mL  0.5 mL Intramuscular Tomorrow-1000 Lily Kocher, MD      . risperiDONE (RISPERDAL) tablet 0.5 mg  0.5 mg Oral Q6H PRN Lily Kocher, MD      . rivaroxaban Alveda Reasons) tablet 20 mg  20 mg Oral Q supper Lily Kocher, MD   20 mg at 08/30/16 1614  . tamsulosin (FLOMAX) capsule 0.4 mg  0.4 mg Oral QHS Lily Kocher, MD   0.4 mg at 08/30/16 2245  . traZODone (DESYREL) tablet 50 mg  50 mg Oral  QHS Lily Kocher, MD   50 mg at 08/30/16 2245     Discharge Medications: Medication List    TAKE these medications   acetaminophen 500 MG tablet Commonly known as:  TYLENOL Take 500 mg by mouth every 4 (four) hours as needed for mild pain, moderate pain, fever or headache.   amantadine 100 MG capsule Commonly known as:  SYMMETREL Take 100 mg by mouth 2 (two) times daily.   busPIRone 10 MG tablet Commonly known as:  BUSPAR Take 10 mg by mouth 2 (two) times daily with a meal.   carbamazepine 200 MG tablet Commonly known as:  TEGRETOL Take 200 mg by mouth 2 (two) times daily with a meal.   cholecalciferol 1000 units tablet Commonly known as:  VITAMIN D Take 1,000 Units by mouth daily.   citalopram 20 MG tablet Commonly known as:  CELEXA Take 20 mg by mouth daily.   divalproex 125 MG capsule Commonly known as:  DEPAKOTE SPRINKLE Take 125 mg by mouth 2 (two) times daily.   donepezil 10 MG tablet Commonly known as:  ARICEPT Take 10 mg by mouth at bedtime.   doxycycline 100 MG tablet Commonly known as:  ADOXA Take 1 tablet (100 mg total) by mouth 2 (two) times daily.   feeding supplement (ENSURE ENLIVE) Liqd Take 237 mLs by mouth 2 (two) times daily between meals.   loperamide 2 MG capsule Commonly known as:  IMODIUM Take 2 mg by mouth as needed for diarrhea or loose stools.   LORazepam 0.5 MG tablet Commonly known as:  ATIVAN Take 0.5 mg by mouth every 8 (eight) hours as needed (for agitation).   magnesium hydroxide 400 MG/5ML suspension Commonly known as:  MILK OF MAGNESIA Take 30 mLs by mouth daily as needed for mild constipation.   Melatonin 1 MG Tabs Take 1 mg by mouth at bedtime.   MINTOX 200-200-20 MG/5ML suspension Generic drug:  alum & mag hydroxide-simeth Take 30 mLs by mouth as needed for indigestion or heartburn.   NAMENDA XR 14 MG Cp24 24 hr capsule Generic drug:  memantine Take 14 mg by mouth daily.    neomycin-bacitracin-polymyxin ointment Commonly known as:  NEOSPORIN Apply 1 application topically as needed for wound care.   OLANZapine 10 MG tablet Commonly known as:  ZYPREXA Take 10 mg by mouth daily with breakfast.   risperiDONE 0.5 MG tablet Commonly known as:  RISPERDAL Take 0.5 mg by mouth every 4 (four) hours as needed (for psychosis/agitation).   rivaroxaban 20 MG  Tabs tablet Commonly known as:  XARELTO Take 20 mg by mouth daily with supper.   ROBAFEN 100 MG/5ML syrup Generic drug:  guaifenesin Take 200 mg by mouth every 6 (six) hours as needed for cough.   tamsulosin 0.4 MG Caps capsule Commonly known as:  FLOMAX Take 0.4 mg by mouth at bedtime.   traZODone 50 MG tablet Commonly known as:  DESYREL Take 50 mg by mouth at bedtime.    Relevant Imaging Results:  Relevant Lab Results:   Additional Information SS# [id number]    Nila Nephew, LCSW

## 2016-08-31 NOTE — NC FL2 (Signed)
[place] LEVEL OF CARE SCREENING TOOL     IDENTIFICATION  Patient Name: Luke Montgomery Birthdate: [birth year] Sex: male Admission Date (Current Location): 08/29/2016  [facility] [place] and [place] Number:  Herbalist and Address:  [facility],  [place] [address], [place]      Provider Number: [id number]  Attending Physician Name and Address:  Patrecia Pour, Christean Grief, MD  Relative Name and Phone Number:       Current Level of Care: Hospital Recommended Level of Care: [place] Prior Approval Number:    Date Approved/Denied:   PASRR Number:    Discharge Plan:  (ALF memory care unit)    Current Diagnoses: Patient Active Problem List   Diagnosis Date Noted  . HCAP (healthcare-associated pneumonia) 08/29/2016  . Hx of pulmonary embolus 08/29/2016  . Syncope 04/15/2016  . Dementia with behavioral disturbance 10/12/2015    Orientation RESPIRATION BLADDER Height & Weight     Self  Normal Incontinent Weight: 146 lb (66.2 kg) Height:  5\' 9"  (175.3 cm)  BEHAVIORAL SYMPTOMS/MOOD NEUROLOGICAL BOWEL NUTRITION STATUS      Incontinent Diet (soft mechanical with ground meat; supplement)  AMBULATORY STATUS COMMUNICATION OF NEEDS Skin   Extensive Assist Verbally Normal                       Personal Care Assistance Level of Assistance  Bathing, Feeding, Dressing Bathing Assistance: Maximum assistance Feeding assistance: Limited assistance Dressing Assistance: Maximum assistance     Functional Limitations Info  Sight, Hearing, Speech Sight Info: Adequate Hearing Info: Adequate Speech Info: Adequate    SPECIAL CARE FACTORS FREQUENCY   (needs secured unit)                    Contractures Contractures Info: Present    Additional Factors Info  Allergies, Code Status Code Status Info: DNR Allergies Info:  Dilaudid Hydromorphone Hcl, Penicillins           Current Medications (08/31/2016):  This is the current  hospital active medication list Current Facility-Administered Medications  Medication Dose Route Frequency Provider Last Rate Last Dose  . amantadine (SYMMETREL) capsule 100 mg  100 mg Oral BID Lily Kocher, MD   100 mg at 08/31/16 0948  . busPIRone (BUSPAR) tablet 10 mg  10 mg Oral BID WC Lily Kocher, MD   10 mg at 08/31/16 0944  . carbamazepine (TEGRETOL) tablet 200 mg  200 mg Oral BID WC Lily Kocher, MD   200 mg at 08/31/16 0944  . citalopram (CELEXA) tablet 20 mg  20 mg Oral Daily Lily Kocher, MD   20 mg at 08/31/16 0945  . divalproex (DEPAKOTE SPRINKLE) capsule 125 mg  125 mg Oral BID Lily Kocher, MD   125 mg at 08/31/16 0944  . donepezil (ARICEPT) tablet 10 mg  10 mg Oral QHS Lily Kocher, MD   10 mg at 08/30/16 2245  . doxycycline (VIBRA-TABS) tablet 100 mg  100 mg Oral Q12H Patrecia Pour, Christean Grief, MD   100 mg at 08/31/16 0945  . feeding supplement (ENSURE ENLIVE) (ENSURE ENLIVE) liquid 237 mL  237 mL Oral BID BM Lily Kocher, MD   237 mL at 08/31/16 0943  . guaifenesin (ROBITUSSIN) 100 MG/5ML syrup 200 mg  200 mg Oral Q6H PRN Lily Kocher, MD      . LORazepam (ATIVAN) tablet 0.5 mg  0.5 mg Oral Q8H PRN Lily Kocher, MD      .  magnesium hydroxide (MILK OF MAGNESIA) suspension 30 mL  30 mL Oral Daily PRN Lily Kocher, MD      . memantine (NAMENDA XR) 24 hr capsule 14 mg  14 mg Oral Daily Lily Kocher, MD   14 mg at 08/31/16 0944  . OLANZapine (ZYPREXA) tablet 10 mg  10 mg Oral Q breakfast Lily Kocher, MD   10 mg at 08/31/16 0945  . pneumococcal 23 valent vaccine (PNU-IMMUNE) injection 0.5 mL  0.5 mL Intramuscular Tomorrow-1000 Lily Kocher, MD      . risperiDONE (RISPERDAL) tablet 0.5 mg  0.5 mg Oral Q6H PRN Lily Kocher, MD      . rivaroxaban Alveda Reasons) tablet 20 mg  20 mg Oral Q supper Lily Kocher, MD   20 mg at 08/30/16 1614  . tamsulosin (FLOMAX) capsule 0.4 mg  0.4 mg Oral QHS Lily Kocher, MD   0.4 mg at 08/30/16 2245  . traZODone (DESYREL) tablet 50 mg  50 mg Oral  QHS Lily Kocher, MD   50 mg at 08/30/16 2245     Discharge Medications: TAKE these medications   acetaminophen 500 MG tablet Commonly known as:  TYLENOL Take 500 mg by mouth every 4 (four) hours as needed for mild pain, moderate pain, fever or headache.   amantadine 100 MG capsule Commonly known as:  SYMMETREL Take 100 mg by mouth 2 (two) times daily.   busPIRone 10 MG tablet Commonly known as:  BUSPAR Take 10 mg by mouth 2 (two) times daily with a meal.   carbamazepine 200 MG tablet Commonly known as:  TEGRETOL Take 200 mg by mouth 2 (two) times daily with a meal.   cholecalciferol 1000 units tablet Commonly known as:  VITAMIN D Take 1,000 Units by mouth daily.   citalopram 20 MG tablet Commonly known as:  CELEXA Take 20 mg by mouth daily.   divalproex 125 MG capsule Commonly known as:  DEPAKOTE SPRINKLE Take 125 mg by mouth 2 (two) times daily.   donepezil 10 MG tablet Commonly known as:  ARICEPT Take 10 mg by mouth at bedtime.   doxycycline 100 MG tablet Commonly known as:  ADOXA Take 1 tablet (100 mg total) by mouth 2 (two) times daily.   loperamide 2 MG capsule Commonly known as:  IMODIUM Take 2 mg by mouth as needed for diarrhea or loose stools.   LORazepam 0.5 MG tablet Commonly known as:  ATIVAN Take 0.5 mg by mouth every 8 (eight) hours as needed (for agitation).   magnesium hydroxide 400 MG/5ML suspension Commonly known as:  MILK OF MAGNESIA Take 30 mLs by mouth daily as needed for mild constipation.   Melatonin 1 MG Tabs Take 1 mg by mouth at bedtime.   MINTOX 200-200-20 MG/5ML suspension Generic drug:  alum & mag hydroxide-simeth Take 30 mLs by mouth as needed for indigestion or heartburn.   NAMENDA XR 14 MG Cp24 24 hr capsule Generic drug:  memantine Take 14 mg by mouth daily.   neomycin-bacitracin-polymyxin ointment Commonly known as:  NEOSPORIN Apply 1 application topically as needed for wound care.   OLANZapine 10  MG tablet Commonly known as:  ZYPREXA Take 10 mg by mouth daily with breakfast.   risperiDONE 0.5 MG tablet Commonly known as:  RISPERDAL Take 0.5 mg by mouth every 4 (four) hours as needed (for psychosis/agitation).   rivaroxaban 20 MG Tabs tablet Commonly known as:  XARELTO Take 20 mg by mouth daily with supper.   ROBAFEN 100 MG/5ML syrup Generic drug:  guaifenesin Take 200 mg by mouth every 6 (six) hours as needed for cough.   tamsulosin 0.4 MG Caps capsule Commonly known as:  FLOMAX Take 0.4 mg by mouth at bedtime.   traZODone 50 MG tablet Commonly known as:  DESYREL Take 50 mg by mouth at bedtime.     Relevant Imaging Results:  Relevant Lab Results:   Additional Information SS# [id number]  Nila Nephew, LCSW

## 2016-08-31 NOTE — Progress Notes (Signed)
LCSW following for discharge planning/disposition: Pt discharging to [facility] assisted living memory care unit.  Patient will transport by [place] completed medical necessity form and called for transportation.  Family notified regarding discharge - daughter Jenny Reichmann via phone All information sent to facility by [place] Report number for RN:  [phone]  No other needs at this time   Plan: DC to return to [facility].   Sharren Bridge, MSW, LCSW Clinical Social Work 08/31/2016 [phone]

## 2016-09-03 LAB — CULTURE, BLOOD (ROUTINE X 2)
CULTURE: NO GROWTH
SPECIAL REQUESTS: ADEQUATE

## 2016-09-04 LAB — CULTURE, BLOOD (ROUTINE X 2)
Culture: NO GROWTH
Special Requests: ADEQUATE

## 2016-10-11 ENCOUNTER — Emergency Department (HOSPITAL_COMMUNITY): Payer: Medicare PPO

## 2016-10-11 ENCOUNTER — Emergency Department (HOSPITAL_COMMUNITY)
Admission: EM | Admit: 2016-10-11 | Discharge: 2016-10-11 | Disposition: A | Discharge status: Home / Self Care | Payer: Medicare PPO | Attending: Emergency Medicine | Admitting: Emergency Medicine

## 2016-10-11 ENCOUNTER — Encounter (HOSPITAL_COMMUNITY): Payer: Self-pay | Admitting: Emergency Medicine

## 2016-10-11 DIAGNOSIS — W19XXXA Unspecified fall, initial encounter: Secondary | ICD-10-CM

## 2016-10-11 DIAGNOSIS — Y929 Unspecified place or not applicable: Secondary | ICD-10-CM | POA: Insufficient documentation

## 2016-10-11 DIAGNOSIS — W0110XA Fall on same level from slipping, tripping and stumbling with subsequent striking against unspecified object, initial encounter: Secondary | ICD-10-CM | POA: Diagnosis not present

## 2016-10-11 DIAGNOSIS — Y9389 Activity, other specified: Secondary | ICD-10-CM | POA: Insufficient documentation

## 2016-10-11 DIAGNOSIS — Z791 Long term (current) use of non-steroidal anti-inflammatories (NSAID): Secondary | ICD-10-CM | POA: Diagnosis not present

## 2016-10-11 DIAGNOSIS — S098XXA Other specified injuries of head, initial encounter: Secondary | ICD-10-CM | POA: Diagnosis not present

## 2016-10-11 DIAGNOSIS — Y999 Unspecified external cause status: Secondary | ICD-10-CM | POA: Diagnosis not present

## 2016-10-11 DIAGNOSIS — S0990XA Unspecified injury of head, initial encounter: Secondary | ICD-10-CM | POA: Diagnosis present

## 2016-10-11 DIAGNOSIS — Z79899 Other long term (current) drug therapy: Secondary | ICD-10-CM | POA: Diagnosis not present

## 2016-10-11 NOTE — ED Notes (Signed)
EMS [id number] not 97

## 2016-10-11 NOTE — ED Triage Notes (Signed)
Arrived by EMS from nursing home patient had a witnessed fall out of a wheelchair hit left side of face. No LOC reported. Patient has a history of dementia and alert to his normal.

## 2016-10-11 NOTE — ED Notes (Signed)
Notified Network engineer to call [facility] for transportation back to nursing home.

## 2016-10-11 NOTE — ED Provider Notes (Signed)
[facility] DEPT Provider Note   CSN: [id number] Arrival date & time: 10/11/16  1653     History   Chief Complaint Chief Complaint  Patient presents with  . Fall    HPI Luke Montgomery is a 83 y.o. male.  HPI Patient with baseline dementia. Level V caveat. Witnessed fall from wheelchair by nursing home staff. The staff believes patient hit his head. No loss of consciousness. Patient is at his baseline mental state.  Past Medical History:  Diagnosis Date  . Dementia   . DVT (deep venous thrombosis) ([place])   . Pulmonary embolism [facility])     Patient Active Problem List   Diagnosis Date Noted  . HCAP (healthcare-associated pneumonia) 08/29/2016  . Hx of pulmonary embolus 08/29/2016  . Syncope 04/15/2016  . Dementia with behavioral disturbance 10/12/2015    Past Surgical History:  Procedure Laterality Date  . HERNIA REPAIR    . SMALL INTESTINE SURGERY     secondary to removal of recurrent polyp       Home Medications    Prior to Admission medications   Medication Sig Start Date End Date Taking? Authorizing Provider  acetaminophen (MAPAP ARTHRITIS PAIN) 650 MG CR tablet Take 650 mg by mouth 3 (three) times daily.   Yes [provider]  acetaminophen (TYLENOL) 500 MG tablet Take 500 mg by mouth every 4 (four) hours as needed for mild pain or headache.   Yes [provider]  alum & mag hydroxide-simeth (MINTOX) 200-200-20 MG/5ML suspension Take 30 mLs by mouth as needed for indigestion or heartburn.   Yes [provider]  amantadine (SYMMETREL) 100 MG capsule Take 100 mg by mouth 2 (two) times daily.   Yes [provider]  busPIRone (BUSPAR) 10 MG tablet Take 10 mg by mouth 2 (two) times daily with a meal.   Yes [provider]  carbamazepine (TEGRETOL) 200 MG tablet Take 200 mg by mouth 2 (two) times daily with a meal.   Yes [provider]  cholecalciferol (VITAMIN D) 1000 units tablet Take 1,000 Units by mouth  daily.   Yes [provider]  citalopram (CELEXA) 20 MG tablet Take 20 mg by mouth daily.   Yes [provider]  divalproex (DEPAKOTE SPRINKLE) 125 MG capsule Take 125 mg by mouth 2 (two) times daily.   Yes [provider]  donepezil (ARICEPT) 10 MG tablet Take 10 mg by mouth at bedtime.   Yes [provider]  guaifenesin (ROBAFEN) 100 MG/5ML syrup Take 200 mg by mouth every 6 (six) hours as needed for cough.   Yes [provider]  ibuprofen (ADVIL,MOTRIN) 800 MG tablet Take 800 mg by mouth every 8 (eight) hours as needed for mild pain.   Yes [provider]  loperamide (IMODIUM) 2 MG capsule Take 2 mg by mouth as needed for diarrhea or loose stools.   Yes [provider]  LORazepam (ATIVAN) 0.5 MG tablet Take 0.5 mg by mouth 3 (three) times daily.    Yes [provider]  magnesium hydroxide (MILK OF MAGNESIA) 400 MG/5ML suspension Take 30 mLs by mouth daily as needed for mild constipation.   Yes [provider]  Melatonin 1 MG TABS Take 1 mg by mouth at bedtime.    Yes [provider]  memantine (NAMENDA XR) 14 MG CP24 24 hr capsule Take 14 mg by mouth daily.   Yes [provider]  neomycin-bacitracin-polymyxin (NEOSPORIN) ointment Apply 1 application topically as needed for wound  care.   Yes [provider]  nystatin (NYSTATIN) powder Apply 1 Bottle topically 2 (two) times daily as needed (groin rash).   Yes [provider]  OLANZapine (ZYPREXA) 15 MG tablet Take 15 mg by mouth daily with breakfast.    Yes [provider]  risperiDONE (RISPERDAL) 0.5 MG tablet Take 0.5 mg by mouth every 4 (four) hours as needed (for psychosis/agitation).   Yes [provider]  rivaroxaban (XARELTO) 20 MG TABS tablet Take 20 mg by mouth daily with supper.   Yes [provider]  tamsulosin (FLOMAX) 0.4 MG CAPS capsule Take 0.4 mg by mouth at bedtime.   Yes [provider]  traZODone (DESYREL) 50 MG tablet Take 50 mg by mouth at bedtime.   Yes [provider]    Family History Family History  Problem Relation Age of Onset  . Family history unknown: Yes    Social History Social History  Substance Use Topics  . Smoking status: Never Smoker  . Smokeless tobacco: Never Used  . Alcohol use No     Allergies   Dilaudid [hydromorphone hcl] and Penicillins   Review of Systems Review of Systems  Unable to perform ROS: Dementia     Physical Exam Updated Vital Signs BP (!) 177/86   Pulse 62   Temp 98.2 F (36.8 C) (Oral)   Resp (!) 22   Ht 5\' 9"  (1.753 m)   Wt 63.5 kg (140 lb)   SpO2 100%   BMI 20.67 kg/m   Physical Exam  Constitutional: He is oriented to person, place, and time. He appears well-developed and well-nourished. No distress.  HENT:  Head: Normocephalic and atraumatic.  Mouth/Throat: Oropharynx is clear and moist. No oropharyngeal exudate.  Eyes: EOM are normal. Pupils are equal, round, and reactive to light.  Neck: Normal range of motion. Neck supple.  No posterior step-off's, focal tenderness or deformity.  Cardiovascular: Normal rate and regular rhythm.  Exam reveals no gallop and no friction rub.   Murmur heard. Pulmonary/Chest: Effort normal and breath sounds normal. No respiratory distress. He has no wheezes. He has no rales. He exhibits no tenderness.  Abdominal: Soft. Bowel sounds are normal. There is no tenderness. There is no rebound and no guarding.  Musculoskeletal: Normal range of motion. He exhibits no edema or tenderness.  Cogwheel  rigidity throughout. No hip pain with range of motion. Distal pulses are 2+. No midline thoracic or lumbar tenderness.  Neurological: He is alert and oriented to person, place, and time.  Moving all extremities without focal deficit. Sensation is grossly intact. Patient oriented only to self.  Skin: Skin is warm and dry. Capillary refill takes less than 2  seconds. No rash noted. No erythema.  Psychiatric: He has a normal mood and affect. His behavior is normal.  Nursing note and vitals reviewed.    ED Treatments / Results  Labs (all labs ordered are listed, but only abnormal results are displayed) Labs Reviewed - No data to display  EKG  EKG Interpretation None       Radiology Ct Head Wo Contrast  Result Date: 10/11/2016 CLINICAL DATA:  Fall today EXAM: CT HEAD WITHOUT CONTRAST CT CERVICAL SPINE WITHOUT CONTRAST TECHNIQUE: Multidetector CT imaging of the head and cervical spine was performed following the standard protocol without intravenous contrast. Multiplanar CT image reconstructions of the cervical spine were also generated. COMPARISON:  06/10/2016 FINDINGS: CT HEAD FINDINGS Brain: There is advanced global atrophy. There are chronic ischemic changes  in the periventricular white matter. No mass effect, midline shift, or acute hemorrhage. Vascular: No hyperdense vessel or unexpected calcification. Skull: Cranium is intact. Sinuses/Orbits: Mastoid air cells are clear. Minimal mucosal thickening towards the base of the left maxillary sinus. Other: Noncontributory CT CERVICAL SPINE FINDINGS Alignment: Anatomic Skull base and vertebrae: There is no fracture or dislocation. C1 is somewhat rotated upon C2. Soft tissues and spinal canal: There is no spinal hematoma. No obvious soft tissue injury. Thyroid gland is heterogeneous. There is at least 1 4 mm low-density lesion in the right lobe. No obvious soft tissue hematoma. Disc levels: There posterior disc osteophytes which are prominent C3-4, C4-5, and C5-6. There is an element of spinal stenosis at these levels. Uncovertebral osteophytes cause foraminal narrowing at C3-4 and C4-5 bilaterally. There are less prominent uncovertebral osteophytes at C5-6 and C6-7 Upper chest: Negative. Other: None IMPRESSION: No acute intracranial pathology No evidence of cervical spine injury Chronic changes are  noted. Electronically Signed   By: Marybelle Killings M.D.   On: 10/11/2016 17:45   Ct Cervical Spine Wo Contrast  Result Date: 10/11/2016 CLINICAL DATA:  Fall today EXAM: CT HEAD WITHOUT CONTRAST CT CERVICAL SPINE WITHOUT CONTRAST TECHNIQUE: Multidetector CT imaging of the head and cervical spine was performed following the standard protocol without intravenous contrast. Multiplanar CT image reconstructions of the cervical spine were also generated. COMPARISON:  06/10/2016 FINDINGS: CT HEAD FINDINGS Brain: There is advanced global atrophy. There are chronic ischemic changes in the periventricular white matter. No mass effect, midline shift, or acute hemorrhage. Vascular: No hyperdense vessel or unexpected calcification. Skull: Cranium is intact. Sinuses/Orbits: Mastoid air cells are clear. Minimal mucosal thickening towards the base of the left maxillary sinus. Other: Noncontributory CT CERVICAL SPINE FINDINGS Alignment: Anatomic Skull base and vertebrae: There is no fracture or dislocation. C1 is somewhat rotated upon C2. Soft tissues and spinal canal: There is no spinal hematoma. No obvious soft tissue injury. Thyroid gland is heterogeneous. There is at least 1 4 mm low-density lesion in the right lobe. No obvious soft tissue hematoma. Disc levels: There posterior disc osteophytes which are prominent C3-4, C4-5, and C5-6. There is an element of spinal stenosis at these levels. Uncovertebral osteophytes cause foraminal narrowing at C3-4 and C4-5 bilaterally. There are less prominent uncovertebral osteophytes at C5-6 and C6-7 Upper chest: Negative. Other: None IMPRESSION: No acute intracranial pathology No evidence of cervical spine injury Chronic changes are noted. Electronically Signed   By: Marybelle Killings M.D.   On: 10/11/2016 17:45    Procedures Procedures (including critical care time)  Medications Ordered in ED Medications - No data to display   Initial Impression / Assessment and Plan / ED Course  I  have reviewed the triage vital signs and the nursing notes.  Pertinent labs & imaging results that were available during my care of the patient were reviewed by me and considered in my medical decision making (see chart for details).     CT without any acute findings. Condition be at baseline. Will discharge back to nursing home.  Final Clinical Impressions(s) / ED Diagnoses   Final diagnoses:  Fall, initial encounter    New Prescriptions New Prescriptions   No medications on file     Julianne Rice, MD 10/11/16 1900

## 2016-10-11 NOTE — ED Notes (Signed)
[facility] contacted for tx back to [facility] 

## 2016-10-11 NOTE — ED Notes (Signed)
Attempted to call nursing home no answer x 2

## 2016-10-11 NOTE — ED Notes (Signed)
[facility] at bedside 

## 2016-10-11 NOTE — ED Notes (Signed)
Pt repositioned in bed, reoriented to call bell, side rails up for safety.  Confirmed with dispatch pt is on the list to be transported back to [facility], unable to give ETA

## 2016-10-28 ENCOUNTER — Emergency Department (HOSPITAL_COMMUNITY)
Admission: EM | Admit: 2016-10-28 | Discharge: 2016-10-28 | Disposition: A | Discharge status: Home / Self Care | Payer: Medicare PPO | Attending: Emergency Medicine | Admitting: Emergency Medicine

## 2016-10-28 DIAGNOSIS — W19XXXA Unspecified fall, initial encounter: Secondary | ICD-10-CM | POA: Diagnosis not present

## 2016-10-28 DIAGNOSIS — Y92129 Unspecified place in nursing home as the place of occurrence of the external cause: Secondary | ICD-10-CM | POA: Diagnosis not present

## 2016-10-28 DIAGNOSIS — S0990XA Unspecified injury of head, initial encounter: Secondary | ICD-10-CM

## 2016-10-28 DIAGNOSIS — Y999 Unspecified external cause status: Secondary | ICD-10-CM | POA: Insufficient documentation

## 2016-10-28 DIAGNOSIS — S0001XA Abrasion of scalp, initial encounter: Secondary | ICD-10-CM | POA: Diagnosis not present

## 2016-10-28 DIAGNOSIS — Y939 Activity, unspecified: Secondary | ICD-10-CM | POA: Diagnosis not present

## 2016-10-28 DIAGNOSIS — Z79899 Other long term (current) drug therapy: Secondary | ICD-10-CM | POA: Insufficient documentation

## 2016-10-28 DIAGNOSIS — F0390 Unspecified dementia without behavioral disturbance: Secondary | ICD-10-CM | POA: Insufficient documentation

## 2016-10-28 DIAGNOSIS — Z7901 Long term (current) use of anticoagulants: Secondary | ICD-10-CM | POA: Insufficient documentation

## 2016-10-28 MED ORDER — BACITRACIN ZINC 500 UNIT/GM EX OINT
TOPICAL_OINTMENT | Freq: Two times a day (BID) | CUTANEOUS | Status: DC
  Administered 2016-10-28: 1 via TOPICAL

## 2016-10-28 NOTE — ED Provider Notes (Signed)
[facility] DEPT Provider Note   CSN: [id number] Arrival date & time: 10/28/16  1148  By signing my name below, I, Ephriam Jenkins, attest that this documentation has been prepared under the direction and in the presence of Virgel Manifold, MD. Electronically signed, Ephriam Jenkins, ED Scribe. 10/28/16. 12:58 PM.  History   Chief Complaint Chief Complaint  Patient presents with  . Fall    HPI HPI Comments: LEVEL 5 CAVEAT DUE TO DEMENTIA Luke Montgomery is a 83 y.o. male with Hx of Dementia, who presents to the Emergency Department s/p unwitnessed fall that occurred this morning. Pt lives at [facility]. They are unsure of the mechanism of the pt's fall. He currently has a small skin tear on the top of his scalp, bleeding currently controlled. Upon initial assessment pt is sleeping in bed and upon waking he is in no distress. He is responding to questions but is not answering intelligibly. He denies any pain.  The history is provided by the patient. No language interpreter was used.   Past Medical History:  Diagnosis Date  . Dementia   . DVT (deep venous thrombosis) ([place])   . Pulmonary embolism [facility])     Patient Active Problem List   Diagnosis Date Noted  . HCAP (healthcare-associated pneumonia) 08/29/2016  . Hx of pulmonary embolus 08/29/2016  . Syncope 04/15/2016  . Dementia with behavioral disturbance 10/12/2015    Past Surgical History:  Procedure Laterality Date  . HERNIA REPAIR    . SMALL INTESTINE SURGERY     secondary to removal of recurrent polyp      Home Medications    Prior to Admission medications   Medication Sig Start Date End Date Taking? Authorizing Provider  acetaminophen (MAPAP ARTHRITIS PAIN) 650 MG CR tablet Take 650 mg by mouth 3 (three) times daily.    [provider]  acetaminophen (TYLENOL) 500 MG tablet Take 500 mg by mouth every 4 (four) hours as needed for mild pain or headache.    [provider]  alum & mag  hydroxide-simeth (MINTOX) 200-200-20 MG/5ML suspension Take 30 mLs by mouth as needed for indigestion or heartburn.    [provider]  amantadine (SYMMETREL) 100 MG capsule Take 100 mg by mouth 2 (two) times daily.    [provider]  busPIRone (BUSPAR) 10 MG tablet Take 10 mg by mouth 2 (two) times daily with a meal.    [provider]  carbamazepine (TEGRETOL) 200 MG tablet Take 200 mg by mouth 2 (two) times daily with a meal.    [provider]  cholecalciferol (VITAMIN D) 1000 units tablet Take 1,000 Units by mouth daily.    [provider]  citalopram (CELEXA) 20 MG tablet Take 20 mg by mouth daily.    [provider]  divalproex (DEPAKOTE SPRINKLE) 125 MG capsule Take 125 mg by mouth 2 (two) times daily.    [provider]  donepezil (ARICEPT) 10 MG tablet Take 10 mg by mouth at bedtime.    [provider]  guaifenesin (ROBAFEN) 100 MG/5ML syrup Take 200 mg by mouth every 6 (six) hours as needed for cough.    [provider]  ibuprofen (ADVIL,MOTRIN) 800 MG tablet Take 800 mg by mouth every 8 (eight) hours as needed for mild pain.    [provider]  loperamide (IMODIUM) 2 MG capsule Take 2 mg by mouth as needed for diarrhea or loose stools.    [provider]  LORazepam (  ATIVAN) 0.5 MG tablet Take 0.5 mg by mouth 3 (three) times daily.     [provider]  magnesium hydroxide (MILK OF MAGNESIA) 400 MG/5ML suspension Take 30 mLs by mouth daily as needed for mild constipation.    [provider]  Melatonin 1 MG TABS Take 1 mg by mouth at bedtime.     [provider]  memantine (NAMENDA XR) 14 MG CP24 24 hr capsule Take 14 mg by mouth daily.    [provider]  neomycin-bacitracin-polymyxin (NEOSPORIN) ointment Apply 1 application topically as needed for wound care.    [provider]  nystatin (NYSTATIN) powder Apply 1 Bottle topically 2 (two) times  daily as needed (groin rash).    [provider]  OLANZapine (ZYPREXA) 15 MG tablet Take 15 mg by mouth daily with breakfast.     [provider]  risperiDONE (RISPERDAL) 0.5 MG tablet Take 0.5 mg by mouth every 4 (four) hours as needed (for psychosis/agitation).    [provider]  rivaroxaban (XARELTO) 20 MG TABS tablet Take 20 mg by mouth daily with supper.    [provider]  tamsulosin (FLOMAX) 0.4 MG CAPS capsule Take 0.4 mg by mouth at bedtime.    [provider]  traZODone (DESYREL) 50 MG tablet Take 50 mg by mouth at bedtime.    [provider]    Family History Family History  Problem Relation Age of Onset  . Family history unknown: Yes    Social History Social History  Substance Use Topics  . Smoking status: Never Smoker  . Smokeless tobacco: Never Used  . Alcohol use No     Allergies   Dilaudid [hydromorphone hcl] and Penicillins   Review of Systems Review of Systems  Unable to perform ROS: Dementia    Physical Exam Updated Vital Signs BP (!) 159/62 (BP Location: Left Arm)   Pulse 65   Temp 97.7 F (36.5 C) (Oral)   Resp 16   SpO2 96%   Physical Exam  Constitutional: He is oriented to person, place, and time. He appears well-developed and well-nourished.  Responds to questions but answers unintelligible. No facial droop.   HENT:  Head: Normocephalic.  2cm abrasion to top of scalp.  Eyes: EOM are normal.  Neck: Normal range of motion.  Cardiovascular: Normal rate, regular rhythm, normal heart sounds and intact distal pulses.   Pulmonary/Chest: Effort normal and breath sounds normal. No respiratory distress.  Abdominal: Soft. He exhibits no distension. There is no tenderness.  Musculoskeletal: Normal range of motion.  Difficult to assess but doesn't seem to have any pain with palpation of the spine nor with ROM of large joints. Increased muscle tone.    Neurological: He is alert and oriented to  person, place, and time.  Skin: Skin is warm and dry.  2cm abrasion to top of scalp.  Psychiatric: He has a normal mood and affect. Judgment normal.  Nursing note and vitals reviewed.    ED Treatments / Results  DIAGNOSTIC STUDIES: Oxygen Saturation is 96% on RA, normal by my interpretation.  COORDINATION OF CARE: 12:57 PM-Discussed treatment plan with pt at bedside and pt agreed to plan.   Labs (all labs ordered are listed, but only abnormal results are displayed) Labs Reviewed - No data to display  EKG  EKG Interpretation None       Radiology No results found.  Procedures Procedures (including critical care time)  Medications Ordered in ED Medications - No data to  display   Initial Impression / Assessment and Plan / ED Course  I have reviewed the triage vital signs and the nursing notes.  Pertinent labs & imaging results that were available during my care of the patient were reviewed by me and considered in my medical decision making (see chart for details).     83 year old male presenting after fall. Unwitnessed. Reportedly at his baseline. He has abrasions of top of the scalp, active bleeding. This is not amenable to closure. Should be fine with local wound care. No blood thinners per medication list. Imaging deferred.  Final Clinical Impressions(s) / ED Diagnoses   Final diagnoses:  Minor head injury, initial encounter    New Prescriptions New Prescriptions   No medications on file    I personally preformed the services scribed in my presence. The recorded information has been reviewed is accurate. Virgel Manifold, MD.     Virgel Manifold, MD 11/05/16 [phone]

## 2016-10-28 NOTE — ED Notes (Signed)
Patient discharged back to facility via [facility]

## 2016-10-28 NOTE — ED Notes (Signed)
[facility] called for transport.  

## 2016-10-28 NOTE — ED Notes (Signed)
Bed: WA29 Expected date:  Expected time:  Means of arrival:  Comments: 

## 2016-10-28 NOTE — ED Notes (Signed)
Bed: VE72 Expected date:  Expected time:  Means of arrival:  Comments: fall

## 2016-10-28 NOTE — ED Notes (Signed)
Attempted to call report to [facility] with no answer.

## 2016-10-28 NOTE — ED Triage Notes (Signed)
Patient had unwitnessed fall at his nursing home, [facility].  Laceration to the top of his head.  Bleeding under control.  Patient alert and oriented per norm.  History of dementia.  Patient verbal with no complaints of pain at this time.

## 2016-10-28 NOTE — ED Notes (Signed)
Bed: [facility] Expected date:  Expected time:  Means of arrival:  Comments: rm 25

## 2016-10-28 NOTE — ED Notes (Signed)
Patient placed in TCU room 29 awaiting [facility] transport back to facility

## 2016-10-28 NOTE — ED Triage Notes (Signed)
Correction to triage note.  Patient lives at [facility].

## 2016-11-21 ENCOUNTER — Encounter (HOSPITAL_COMMUNITY): Payer: Self-pay

## 2016-11-21 ENCOUNTER — Emergency Department (HOSPITAL_COMMUNITY): Payer: Medicare PPO

## 2016-11-21 ENCOUNTER — Inpatient Hospital Stay (HOSPITAL_COMMUNITY)
Admission: EM | Admit: 2016-11-21 | Discharge: 2016-12-02 | DRG: 871 | Disposition: E | Payer: Medicare PPO | Attending: Internal Medicine | Admitting: Internal Medicine

## 2016-11-21 DIAGNOSIS — R233 Spontaneous ecchymoses: Secondary | ICD-10-CM | POA: Diagnosis not present

## 2016-11-21 DIAGNOSIS — E870 Hyperosmolality and hypernatremia: Secondary | ICD-10-CM | POA: Diagnosis present

## 2016-11-21 DIAGNOSIS — D6959 Other secondary thrombocytopenia: Secondary | ICD-10-CM | POA: Diagnosis present

## 2016-11-21 DIAGNOSIS — R402342 Coma scale, best motor response, flexion withdrawal, at arrival to emergency department: Secondary | ICD-10-CM | POA: Diagnosis present

## 2016-11-21 DIAGNOSIS — R739 Hyperglycemia, unspecified: Secondary | ICD-10-CM | POA: Diagnosis present

## 2016-11-21 DIAGNOSIS — R627 Adult failure to thrive: Secondary | ICD-10-CM | POA: Diagnosis present

## 2016-11-21 DIAGNOSIS — J189 Pneumonia, unspecified organism: Secondary | ICD-10-CM | POA: Diagnosis present

## 2016-11-21 DIAGNOSIS — Z880 Allergy status to penicillin: Secondary | ICD-10-CM | POA: Diagnosis not present

## 2016-11-21 DIAGNOSIS — Z993 Dependence on wheelchair: Secondary | ICD-10-CM

## 2016-11-21 DIAGNOSIS — R29724 NIHSS score 24: Secondary | ICD-10-CM | POA: Diagnosis present

## 2016-11-21 DIAGNOSIS — I5030 Unspecified diastolic (congestive) heart failure: Secondary | ICD-10-CM | POA: Diagnosis not present

## 2016-11-21 DIAGNOSIS — E872 Acidosis, unspecified: Secondary | ICD-10-CM

## 2016-11-21 DIAGNOSIS — R402122 Coma scale, eyes open, to pain, at arrival to emergency department: Secondary | ICD-10-CM | POA: Diagnosis present

## 2016-11-21 DIAGNOSIS — Z66 Do not resuscitate: Secondary | ICD-10-CM | POA: Diagnosis present

## 2016-11-21 DIAGNOSIS — R4182 Altered mental status, unspecified: Secondary | ICD-10-CM

## 2016-11-21 DIAGNOSIS — G92 Toxic encephalopathy: Secondary | ICD-10-CM | POA: Diagnosis present

## 2016-11-21 DIAGNOSIS — E43 Unspecified severe protein-calorie malnutrition: Secondary | ICD-10-CM | POA: Diagnosis present

## 2016-11-21 DIAGNOSIS — N179 Acute kidney failure, unspecified: Secondary | ICD-10-CM | POA: Diagnosis present

## 2016-11-21 DIAGNOSIS — N183 Chronic kidney disease, stage 3 (moderate): Secondary | ICD-10-CM | POA: Diagnosis present

## 2016-11-21 DIAGNOSIS — I5032 Chronic diastolic (congestive) heart failure: Secondary | ICD-10-CM | POA: Diagnosis present

## 2016-11-21 DIAGNOSIS — J9601 Acute respiratory failure with hypoxia: Secondary | ICD-10-CM

## 2016-11-21 DIAGNOSIS — A4101 Sepsis due to Methicillin susceptible Staphylococcus aureus: Secondary | ICD-10-CM | POA: Diagnosis present

## 2016-11-21 DIAGNOSIS — R7989 Other specified abnormal findings of blood chemistry: Secondary | ICD-10-CM

## 2016-11-21 DIAGNOSIS — R7881 Bacteremia: Secondary | ICD-10-CM | POA: Diagnosis not present

## 2016-11-21 DIAGNOSIS — R579 Shock, unspecified: Secondary | ICD-10-CM | POA: Diagnosis not present

## 2016-11-21 DIAGNOSIS — Z6823 Body mass index (BMI) 23.0-23.9, adult: Secondary | ICD-10-CM

## 2016-11-21 DIAGNOSIS — D696 Thrombocytopenia, unspecified: Secondary | ICD-10-CM | POA: Diagnosis not present

## 2016-11-21 DIAGNOSIS — Z86711 Personal history of pulmonary embolism: Secondary | ICD-10-CM

## 2016-11-21 DIAGNOSIS — R296 Repeated falls: Secondary | ICD-10-CM | POA: Diagnosis present

## 2016-11-21 DIAGNOSIS — R410 Disorientation, unspecified: Secondary | ICD-10-CM | POA: Diagnosis present

## 2016-11-21 DIAGNOSIS — R6520 Severe sepsis without septic shock: Secondary | ICD-10-CM | POA: Diagnosis present

## 2016-11-21 DIAGNOSIS — Z86718 Personal history of other venous thrombosis and embolism: Secondary | ICD-10-CM

## 2016-11-21 DIAGNOSIS — Z7901 Long term (current) use of anticoagulants: Secondary | ICD-10-CM

## 2016-11-21 DIAGNOSIS — A419 Sepsis, unspecified organism: Secondary | ICD-10-CM | POA: Diagnosis not present

## 2016-11-21 DIAGNOSIS — R55 Syncope and collapse: Secondary | ICD-10-CM | POA: Diagnosis present

## 2016-11-21 DIAGNOSIS — F0391 Unspecified dementia with behavioral disturbance: Secondary | ICD-10-CM | POA: Diagnosis present

## 2016-11-21 DIAGNOSIS — N400 Enlarged prostate without lower urinary tract symptoms: Secondary | ICD-10-CM | POA: Diagnosis present

## 2016-11-21 DIAGNOSIS — F03918 Unspecified dementia, unspecified severity, with other behavioral disturbance: Secondary | ICD-10-CM | POA: Diagnosis present

## 2016-11-21 DIAGNOSIS — Z885 Allergy status to narcotic agent status: Secondary | ICD-10-CM | POA: Diagnosis not present

## 2016-11-21 DIAGNOSIS — R778 Other specified abnormalities of plasma proteins: Secondary | ICD-10-CM

## 2016-11-21 DIAGNOSIS — I959 Hypotension, unspecified: Secondary | ICD-10-CM

## 2016-11-21 DIAGNOSIS — R402212 Coma scale, best verbal response, none, at arrival to emergency department: Secondary | ICD-10-CM | POA: Diagnosis present

## 2016-11-21 DIAGNOSIS — F0390 Unspecified dementia without behavioral disturbance: Secondary | ICD-10-CM | POA: Diagnosis not present

## 2016-11-21 DIAGNOSIS — R0602 Shortness of breath: Secondary | ICD-10-CM

## 2016-11-21 DIAGNOSIS — Y95 Nosocomial condition: Secondary | ICD-10-CM | POA: Diagnosis present

## 2016-11-21 DIAGNOSIS — R000 Tachycardia, unspecified: Secondary | ICD-10-CM | POA: Diagnosis not present

## 2016-11-21 DIAGNOSIS — G9341 Metabolic encephalopathy: Secondary | ICD-10-CM | POA: Diagnosis not present

## 2016-11-21 LAB — DIFFERENTIAL
BASOS PCT: 0 %
Basophils Absolute: 0 10*3/uL (ref 0.0–0.1)
EOS PCT: 0 %
Eosinophils Absolute: 0 10*3/uL (ref 0.0–0.7)
LYMPHS PCT: 18 %
Lymphs Abs: 1.9 10*3/uL (ref 0.7–4.0)
MONO ABS: 0.9 10*3/uL (ref 0.1–1.0)
Monocytes Relative: 8 %
NEUTROS PCT: 74 %
Neutro Abs: 7.8 10*3/uL — ABNORMAL HIGH (ref 1.7–7.7)

## 2016-11-21 LAB — DIC (DISSEMINATED INTRAVASCULAR COAGULATION)PANEL
D-Dimer, Quant: >20 ug/mL-FEU — ABNORMAL HIGH (ref 0.00–0.50)
Fibrinogen: 336 mg/dL (ref 210–475)
INR: 1.46
Platelets: 71 10*3/uL — ABNORMAL LOW (ref 150–400)
aPTT: 36 seconds (ref 24–36)

## 2016-11-21 LAB — I-STAT ARTERIAL BLOOD GAS, ED
Acid-base deficit: 3 mmol/L — ABNORMAL HIGH (ref 0.0–2.0)
Bicarbonate: 21.8 mmol/L (ref 20.0–28.0)
O2 SAT: 98 %
PCO2 ART: 37.1 mmHg (ref 32.0–48.0)
PH ART: 7.376 (ref 7.350–7.450)
PO2 ART: 99 mmHg (ref 83.0–108.0)
TCO2: 23 mmol/L (ref 0–100)

## 2016-11-21 LAB — SAVE SMEAR

## 2016-11-21 LAB — CBC
HCT: 43.9 % (ref 39.0–52.0)
Hemoglobin: 13.9 g/dL (ref 13.0–17.0)
MCH: 30.2 pg (ref 26.0–34.0)
MCHC: 31.7 g/dL (ref 30.0–36.0)
MCV: 95.4 fL (ref 78.0–100.0)
PLATELETS: 87 10*3/uL — AB (ref 150–400)
RBC: 4.6 MIL/uL (ref 4.22–5.81)
RDW: 14.7 % (ref 11.5–15.5)
WBC: 10.6 10*3/uL — AB (ref 4.0–10.5)

## 2016-11-21 LAB — URINALYSIS, ROUTINE W REFLEX MICROSCOPIC
Bilirubin Urine: NEGATIVE
Glucose, UA: NEGATIVE mg/dL
HGB URINE DIPSTICK: NEGATIVE
Ketones, ur: NEGATIVE mg/dL
LEUKOCYTES UA: NEGATIVE
Nitrite: NEGATIVE
PROTEIN: NEGATIVE mg/dL
SPECIFIC GRAVITY, URINE: 1.023 (ref 1.005–1.030)
pH: 5 (ref 5.0–8.0)

## 2016-11-21 LAB — COMPREHENSIVE METABOLIC PANEL
ALBUMIN: 3.4 g/dL — AB (ref 3.5–5.0)
ALT: 83 U/L — ABNORMAL HIGH (ref 17–63)
ANION GAP: 17 — AB (ref 5–15)
AST: 115 U/L — ABNORMAL HIGH (ref 15–41)
Alkaline Phosphatase: 103 U/L (ref 38–126)
BUN: 35 mg/dL — ABNORMAL HIGH (ref 6–20)
CHLORIDE: 114 mmol/L — AB (ref 101–111)
CO2: 18 mmol/L — AB (ref 22–32)
Calcium: 9.1 mg/dL (ref 8.9–10.3)
Creatinine, Ser: 1.8 mg/dL — ABNORMAL HIGH (ref 0.61–1.24)
GFR calc Af Amer: 37 mL/min — ABNORMAL LOW (ref >60)
GFR calc non Af Amer: 32 mL/min — ABNORMAL LOW (ref >60)
GLUCOSE: 154 mg/dL — AB (ref 65–99)
Potassium: 4.5 mmol/L (ref 3.5–5.1)
SODIUM: 149 mmol/L — AB (ref 135–145)
Total Bilirubin: 1.4 mg/dL — ABNORMAL HIGH (ref 0.3–1.2)
Total Protein: 6.2 g/dL — ABNORMAL LOW (ref 6.5–8.1)

## 2016-11-21 LAB — LACTIC ACID, PLASMA
LACTIC ACID, VENOUS: 2.5 mmol/L — AB (ref 0.5–1.9)
Lactic Acid, Venous: 2 mmol/L (ref 0.5–1.9)

## 2016-11-21 LAB — DIC (DISSEMINATED INTRAVASCULAR COAGULATION) PANEL
PROTHROMBIN TIME: 17.9 s — AB (ref 11.4–15.2)
SMEAR REVIEW: NONE SEEN

## 2016-11-21 LAB — I-STAT TROPONIN, ED: Troponin i, poc: 1.28 ng/mL (ref 0.00–0.08)

## 2016-11-21 LAB — I-STAT CG4 LACTIC ACID, ED
LACTIC ACID, VENOUS: 1.98 mmol/L — AB (ref 0.5–1.9)
Lactic Acid, Venous: 7.07 mmol/L (ref 0.5–1.9)

## 2016-11-21 LAB — PROCALCITONIN: Procalcitonin: 0.14 ng/mL

## 2016-11-21 LAB — PROTIME-INR
INR: 1.31
PROTHROMBIN TIME: 16.4 s — AB (ref 11.4–15.2)

## 2016-11-21 LAB — APTT: aPTT: 30 seconds (ref 24–36)

## 2016-11-21 MED ORDER — ACETAMINOPHEN 650 MG RE SUPP
650.0000 mg | Freq: Four times a day (QID) | RECTAL | Status: DC | PRN
  Administered 2016-11-24: 650 mg via RECTAL
  Filled 2016-11-21: qty 1

## 2016-11-21 MED ORDER — CITALOPRAM HYDROBROMIDE 20 MG PO TABS
20.0000 mg | ORAL_TABLET | Freq: Every day | ORAL | Status: DC
  Administered 2016-11-22 – 2016-11-25 (×4): 20 mg via ORAL
  Filled 2016-11-21 (×3): qty 1

## 2016-11-21 MED ORDER — MELATONIN 1 MG PO TABS: 1.0000 mg | ORAL_TABLET | Freq: Every day | ORAL | Status: DC

## 2016-11-21 MED ORDER — NALOXONE HCL 2 MG/2ML IJ SOSY
PREFILLED_SYRINGE | INTRAMUSCULAR | Status: AC
  Administered 2016-11-21: 2 mg
  Filled 2016-11-21: qty 2

## 2016-11-21 MED ORDER — SENNOSIDES-DOCUSATE SODIUM 8.6-50 MG PO TABS: 1.0000 | ORAL_TABLET | Freq: Every evening | ORAL | Status: DC | PRN

## 2016-11-21 MED ORDER — SODIUM CHLORIDE 0.9 % IV BOLUS (SEPSIS)
1000.0000 mL | Freq: Once | INTRAVENOUS | Status: AC
Start: 2016-11-21 — End: 2016-11-21
  Administered 2016-11-21: 1000 mL via INTRAVENOUS

## 2016-11-21 MED ORDER — DEXTROSE 5 % IV SOLN
2.0000 g | Freq: Once | INTRAVENOUS | Status: AC
  Administered 2016-11-21: 2 g via INTRAVENOUS
  Filled 2016-11-21: qty 2

## 2016-11-21 MED ORDER — ONDANSETRON HCL 4 MG PO TABS: 4.0000 mg | ORAL_TABLET | Freq: Four times a day (QID) | ORAL | Status: DC | PRN

## 2016-11-21 MED ORDER — CARBAMAZEPINE 200 MG PO TABS
200.0000 mg | ORAL_TABLET | Freq: Two times a day (BID) | ORAL | Status: DC
  Administered 2016-11-22 – 2016-11-25 (×5): 200 mg via ORAL
  Filled 2016-11-21 (×6): qty 1

## 2016-11-21 MED ORDER — LORAZEPAM 0.5 MG PO TABS
0.5000 mg | ORAL_TABLET | Freq: Three times a day (TID) | ORAL | Status: DC
  Administered 2016-11-21 – 2016-11-24 (×7): 0.5 mg via ORAL
  Filled 2016-11-21 (×8): qty 1

## 2016-11-21 MED ORDER — ONDANSETRON HCL 4 MG/2ML IJ SOLN: 4.0000 mg | Freq: Four times a day (QID) | INTRAMUSCULAR | Status: DC | PRN

## 2016-11-21 MED ORDER — SODIUM CHLORIDE 0.9 % IV BOLUS (SEPSIS)
500.0000 mL | Freq: Once | INTRAVENOUS | Status: AC
  Administered 2016-11-21: 500 mL via INTRAVENOUS

## 2016-11-21 MED ORDER — RISPERIDONE 0.5 MG PO TABS
0.5000 mg | ORAL_TABLET | ORAL | Status: DC | PRN
  Administered 2016-11-21: 0.5 mg via ORAL
  Filled 2016-11-21 (×3): qty 1

## 2016-11-21 MED ORDER — VANCOMYCIN HCL IN DEXTROSE 1-5 GM/200ML-% IV SOLN: 1000.0000 mg | Freq: Once | INTRAVENOUS | Status: DC

## 2016-11-21 MED ORDER — ORAL CARE MOUTH RINSE
15.0000 mL | Freq: Two times a day (BID) | OROMUCOSAL | Status: DC
  Administered 2016-11-21 – 2016-11-25 (×7): 15 mL via OROMUCOSAL

## 2016-11-21 MED ORDER — DONEPEZIL HCL 10 MG PO TABS
10.0000 mg | ORAL_TABLET | Freq: Every day | ORAL | Status: DC
  Administered 2016-11-21 – 2016-11-24 (×4): 10 mg via ORAL
  Filled 2016-11-21 (×5): qty 1

## 2016-11-21 MED ORDER — BISACODYL 10 MG RE SUPP: 10.0000 mg | Freq: Every day | RECTAL | Status: DC | PRN

## 2016-11-21 MED ORDER — BUSPIRONE HCL 10 MG PO TABS
10.0000 mg | ORAL_TABLET | Freq: Two times a day (BID) | ORAL | Status: DC
  Administered 2016-11-22 – 2016-11-25 (×5): 10 mg via ORAL
  Filled 2016-11-21 (×9): qty 1

## 2016-11-21 MED ORDER — SODIUM CHLORIDE 0.9 % IV SOLN
INTRAVENOUS | Status: DC
  Administered 2016-11-21: 19:00:00 via INTRAVENOUS

## 2016-11-21 MED ORDER — AMANTADINE HCL 100 MG PO CAPS
100.0000 mg | ORAL_CAPSULE | Freq: Two times a day (BID) | ORAL | Status: DC
  Administered 2016-11-21 – 2016-11-25 (×8): 100 mg via ORAL
  Filled 2016-11-21 (×8): qty 1

## 2016-11-21 MED ORDER — SODIUM CHLORIDE 0.9 % IV BOLUS (SEPSIS)
1000.0000 mL | Freq: Once | INTRAVENOUS | Status: AC
  Administered 2016-11-21: 1000 mL via INTRAVENOUS

## 2016-11-21 MED ORDER — LEVOFLOXACIN IN D5W 750 MG/150ML IV SOLN
750.0000 mg | Freq: Once | INTRAVENOUS | Status: AC
Start: 2016-11-21 — End: 2016-11-21
  Administered 2016-11-21: 750 mg via INTRAVENOUS
  Filled 2016-11-21: qty 150

## 2016-11-21 MED ORDER — NALOXONE HCL 0.4 MG/ML IJ SOLN
INTRAMUSCULAR | Status: AC
  Filled 2016-11-21: qty 1

## 2016-11-21 MED ORDER — VANCOMYCIN HCL IN DEXTROSE 750-5 MG/150ML-% IV SOLN
750.0000 mg | INTRAVENOUS | Status: DC
  Administered 2016-11-22: 750 mg via INTRAVENOUS
  Filled 2016-11-21: qty 150

## 2016-11-21 MED ORDER — DEXTROSE 5 % IV SOLN
1.0000 g | Freq: Three times a day (TID) | INTRAVENOUS | Status: DC
  Administered 2016-11-21 – 2016-11-22 (×3): 1 g via INTRAVENOUS
  Filled 2016-11-21 (×5): qty 1

## 2016-11-21 MED ORDER — TRAZODONE HCL 50 MG PO TABS
50.0000 mg | ORAL_TABLET | Freq: Every day | ORAL | Status: DC
  Administered 2016-11-22 – 2016-11-24 (×3): 50 mg via ORAL
  Filled 2016-11-21 (×4): qty 1

## 2016-11-21 MED ORDER — MAGNESIUM HYDROXIDE 400 MG/5ML PO SUSP: 30.0000 mL | Freq: Every day | ORAL | Status: DC | PRN

## 2016-11-21 MED ORDER — TAMSULOSIN HCL 0.4 MG PO CAPS
0.4000 mg | ORAL_CAPSULE | Freq: Every day | ORAL | Status: DC
  Administered 2016-11-22 – 2016-11-24 (×3): 0.4 mg via ORAL
  Filled 2016-11-21 (×3): qty 1

## 2016-11-21 MED ORDER — VANCOMYCIN HCL 10 G IV SOLR
1500.0000 mg | Freq: Once | INTRAVENOUS | Status: AC
  Administered 2016-11-21: 1500 mg via INTRAVENOUS
  Filled 2016-11-21: qty 1500

## 2016-11-21 MED ORDER — LEVOFLOXACIN IN D5W 500 MG/100ML IV SOLN
500.0000 mg | INTRAVENOUS | Status: DC
  Administered 2016-11-23: 500 mg via INTRAVENOUS
  Filled 2016-11-21 (×2): qty 100

## 2016-11-21 MED ORDER — ACETAMINOPHEN 325 MG PO TABS
650.0000 mg | ORAL_TABLET | Freq: Four times a day (QID) | ORAL | Status: DC | PRN
  Administered 2016-11-23 – 2016-11-24 (×2): 650 mg via ORAL
  Filled 2016-11-21 (×2): qty 2

## 2016-11-21 MED ORDER — MEMANTINE HCL ER 14 MG PO CP24
14.0000 mg | ORAL_CAPSULE | Freq: Every day | ORAL | Status: DC
  Administered 2016-11-22 – 2016-11-25 (×4): 14 mg via ORAL
  Filled 2016-11-21 (×4): qty 1

## 2016-11-21 NOTE — ED Notes (Signed)
Critical care at bedside  

## 2016-11-21 NOTE — ED Notes (Signed)
Per icu pt will be admitted to SDU

## 2016-11-21 NOTE — Consult Note (Addendum)
Neurology Consultation  Reason for Consult: Code stroke (later canceled) Referring Physician: Dr. Sherry Ruffing  CC: Found down  History is obtained from: EMS  HPI: Luke Montgomery is a 83 y.o. male who has a documented past medical history of dementia, DVT and PE, presumably on a new or oral anticoagulant (medication list from the facility did not have the anticoagulant as one of the active medications-but the [facility] chart does have antiviral and is active) was brought in from a nursing facility where he was found down this afternoon. His last known normal was around between 10:30 AM and 11 AM. The nursing facility rounds on the patient's every 2 hours. On thereafter, they found the patient completely unresponsive. They called EMS. Fingerstick glucoses reportedly was within normal limits. He was initially breathing at about 16 breaths per minute and then his work of breathing increased and he had to be ventilated via bag in the ambulance. He has advanced directives with DNR/DNI and hence he was not intubated. On initial examination right as he was taken off of the ambulance, the patient was completely unresponsive to noxious stimulus or voice. His pupils were pinpoint. He had no gaze preference or deviation. He was breathing 25-30 breaths per minute. He had increased tone overall. No reported seizure activity. Medication list includes Depakote 125 twice a day and carbamazepine 200 twice a day. Based on the chart review not for seizures but but for possible behavioral reasons - advanced dementia with behavioral issues.   LKW: 10:30 or 11 AM tpa given?: no, less likely stroke Premorbid modified Rankin scale (mRS):  4-Needs assistance to walk and tend to bodily needs   ROS: Unable to obtain due to patient's mentation   Past Medical History:  Diagnosis Date  . Dementia   . DVT (deep venous thrombosis) ([place])   . Pulmonary embolism ([facility])     Family History  Problem Relation Age of Onset  .  Family history unknown: Yes    Social History:   reports that he has never smoked. He has never used smokeless tobacco. He reports that he does not drink alcohol or use drugs.   Medications  Current Facility-Administered Medications:  .  aztreonam (AZACTAM) 1 g in dextrose 5 % 50 mL IVPB, 1 g, Intravenous, Q8H, Rumbarger, Rachel L, RPH .  aztreonam (AZACTAM) 2 g in dextrose 5 % 50 mL IVPB, 2 g, Intravenous, Once, Tegeler, Gwenyth Allegra, MD .  Derrill Memo ON 11/23/2016] levofloxacin (LEVAQUIN) IVPB 500 mg, 500 mg, Intravenous, Q48H, Rumbarger, Rachel L, RPH .  levofloxacin (LEVAQUIN) IVPB 750 mg, 750 mg, Intravenous, Once, Tegeler, Gwenyth Allegra, MD .  sodium chloride 0.9 % bolus 1,000 mL, 1,000 mL, Intravenous, Once, Tegeler, Gwenyth Allegra, MD .  vancomycin (VANCOCIN) 1,500 mg in sodium chloride 0.9 % 500 mL IVPB, 1,500 mg, Intravenous, Once, Rumbarger, Valeda Malm, RPH .  [START ON 11/22/2016] vancomycin (VANCOCIN) IVPB 750 mg/150 ml premix, 750 mg, Intravenous, Q24H, Rumbarger, Valeda Malm, RPH  Current Outpatient Prescriptions:  .  acetaminophen (MAPAP ARTHRITIS PAIN) 650 MG CR tablet, Take 650 mg by mouth 3 (three) times daily., Disp: , Rfl:  .  acetaminophen (TYLENOL) 500 MG tablet, Take 500 mg by mouth every 4 (four) hours as needed for mild pain or headache., Disp: , Rfl:  .  alum & mag hydroxide-simeth (MINTOX) 160-109-32 MG/5ML suspension, Take 30 mLs by mouth as needed for indigestion or heartburn., Disp: , Rfl:  .  amantadine (SYMMETREL) 100 MG capsule, Take 100 mg  by mouth 2 (two) times daily., Disp: , Rfl:  .  busPIRone (BUSPAR) 10 MG tablet, Take 10 mg by mouth 2 (two) times daily with a meal., Disp: , Rfl:  .  carbamazepine (TEGRETOL) 200 MG tablet, Take 200 mg by mouth 2 (two) times daily with a meal., Disp: , Rfl:  .  cholecalciferol (VITAMIN D) 1000 units tablet, Take 1,000 Units by mouth daily., Disp: , Rfl:  .  citalopram (CELEXA) 20 MG tablet, Take 20 mg by mouth daily., Disp: , Rfl:   .  divalproex (DEPAKOTE SPRINKLE) 125 MG capsule, Take 125 mg by mouth 2 (two) times daily., Disp: , Rfl:  .  donepezil (ARICEPT) 10 MG tablet, Take 10 mg by mouth at bedtime., Disp: , Rfl:  .  guaifenesin (ROBAFEN) 100 MG/5ML syrup, Take 200 mg by mouth every 6 (six) hours as needed for cough., Disp: , Rfl:  .  ibuprofen (ADVIL,MOTRIN) 800 MG tablet, Take 800 mg by mouth every 8 (eight) hours as needed for mild pain., Disp: , Rfl:  .  loperamide (IMODIUM) 2 MG capsule, Take 2 mg by mouth as needed for diarrhea or loose stools., Disp: , Rfl:  .  LORazepam (ATIVAN) 0.5 MG tablet, Take 0.5 mg by mouth 3 (three) times daily. , Disp: , Rfl:  .  magnesium hydroxide (MILK OF MAGNESIA) 400 MG/5ML suspension, Take 30 mLs by mouth daily as needed for mild constipation., Disp: , Rfl:  .  Melatonin 1 MG TABS, Take 1 mg by mouth at bedtime. , Disp: , Rfl:  .  memantine (NAMENDA XR) 14 MG CP24 24 hr capsule, Take 14 mg by mouth daily., Disp: , Rfl:  .  neomycin-bacitracin-polymyxin (NEOSPORIN) ointment, Apply 1 application topically as needed for wound care., Disp: , Rfl:  .  nystatin (NYSTATIN) powder, Apply 1 Bottle topically 2 (two) times daily as needed (groin rash)., Disp: , Rfl:  .  OLANZapine (ZYPREXA) 15 MG tablet, Take 15 mg by mouth daily with breakfast. , Disp: , Rfl:  .  risperiDONE (RISPERDAL) 0.5 MG tablet, Take 0.5 mg by mouth every 4 (four) hours as needed (for psychosis/agitation)., Disp: , Rfl:  .  rivaroxaban (XARELTO) 20 MG TABS tablet, Take 20 mg by mouth daily with supper., Disp: , Rfl:  .  tamsulosin (FLOMAX) 0.4 MG CAPS capsule, Take 0.4 mg by mouth at bedtime., Disp: , Rfl:  .  traZODone (DESYREL) 50 MG tablet, Take 50 mg by mouth at bedtime., Disp: , Rfl:    Exam: Current vital signs: BP (!) 67/47   Pulse (!) 42   Temp 98.9 F (37.2 C) (Rectal)   Resp (!) 26   Wt 72.2 kg (159 lb 2.8 oz)   SpO2 (!) 76%   BMI 23.51 kg/m  Vital signs in last 24 hours: Temp:  [98.9 F (37.2  C)] 98.9 F (37.2 C) (08/21 1335) Pulse Rate:  [42-110] 42 (08/21 1315) Resp:  [18-30] 26 (08/21 1340) BP: (67-96)/(47-54) 67/47 (08/21 1340) SpO2:  [76 %-94 %] 76 % (08/21 1315) Weight:  [72.2 kg (159 lb 2.8 oz)] 72.2 kg (159 lb 2.8 oz) (08/21 1325) Gen.: Unresponsive, cachectic-looking man, ventilated via bag mask. HEENT: Normocephalic, atraumatic, dry oral mucous membranes, some tongue tremors noted. CVS: S1-S2 heard, regular rate rhythm Respiratory: Chest clear to auscultation bilaterally Extremities: Warm well-perfused, 1+ pedal edema bilaterally Neurological exam Patient is completely obtunded. No response to voice. No response to noxious tinnitus initially. Was given Narcan and after that did purposefully try  to localize in both upper extremities and had slight withdrawal in both lower extremities. Pupils are equal, pinpoint, sluggishly reactive. No gaze preference/deviation. Blinks to threat. He is edentulous but it's difficult to ascertain facial asymmetry. Some localization in response to noxious stimulus with both upper extremities, minimal withdrawal in both lower extremities to extreme noxious stim.  NIHSS 1a Level of Conscious.: 3 1b LOC Questions: 2 1c LOC Commands:2  2 Best Gaze: 0 3 Visual: 0 4 Facial Palsy: 0 5a Motor Arm - left: 3 5b Motor Arm - Right: 3 6a Motor Leg - Left: 3 6b Motor Leg - Right: 3 7 Limb Ataxia: 0 8 Sensory: 0 9 Best Language: 3 10 Dysarthria: 2 11 Extinct. and Inatten.: 0 TOTAL: 24   Labs I have reviewed labs in epic and the results pertinent to this consultation are: Pertinent for mild leukocytosis, hypernatremia, hyperglycemia, elevated BUN and creatinine, low protein, low albumin, deranged LFTs with increased bilirubin total 1.4, decreased GFR.  CBC    Component Value Date/Time   WBC 10.6 (H) 12/01/2016 1300   RBC 4.60 11/27/2016 1300   HGB 13.9 11/23/2016 1300   HCT 43.9 11/18/2016 1300   PLT 87 (L) 11/20/2016 1300   MCV  95.4 12/01/2016 1300   MCH 30.2 11/10/2016 1300   MCHC 31.7 11/08/2016 1300   RDW 14.7 11/11/2016 1300   LYMPHSABS 1.9 11/22/2016 1300   MONOABS 0.9 11/15/2016 1300   EOSABS 0.0 12/01/2016 1300   BASOSABS 0.0 11/17/2016 1300    CMP     Component Value Date/Time   NA 149 (H) 11/14/2016 1300   K 4.5 11/14/2016 1300   CL 114 (H) 11/20/2016 1300   CO2 18 (L) 11/28/2016 1300   GLUCOSE 154 (H) 11/02/2016 1300   BUN 35 (H) 11/13/2016 1300   CREATININE 1.80 (H) 11/19/2016 1300   CALCIUM 9.1 11/24/2016 1300   PROT 6.2 (L) 11/20/2016 1300   ALBUMIN 3.4 (L) 11/05/2016 1300   AST 115 (H) 11/17/2016 1300   ALT 83 (H) 11/29/2016 1300   ALKPHOS 103 11/09/2016 1300   BILITOT 1.4 (H) 11/03/2016 1300   GFRNONAA 32 (L) 11/02/2016 1300   GFRAA 37 (L) 11/27/2016 1300  Also has Troponinemia EKG did not show any acute abnormalities. PT/INR 16.4/1.31  Imaging I have reviewed the images obtained:  CT-scan of the brain - Shows no acute changes. Chronic white matter disease and evidence of probably multiple old ischemic insults. Generalized atrophy.   Assessment:  83 year old man with a past medical history of dementia with behavioral disturbances, DVT and PE presumably on Rivaroxaban, who is a resident of a nursing home was brought in after being found down by the nursing home staff. No reported history of seizure or witnessed seizure. On examination, he is severely encephalopathic with no focality. His systolic blood pressures were in the 70s. His breathing was also labored.He is on multiple antianxiety/antipsychotics. He has multiple metabolic derangements on his complaints of metabolic panel and CBC. In my assessment, this is less likely a stroke and more likely toxic metabolic encephalopathy. Given that he is on multiple antipsychotics along with SSRI, serotonin syndrome should also be a consideration as a cause of his altered mental status. Another diagnostic consideration is cardiac-in the  presence of elevated troponins.  Impression: Toxic metabolic encephalopathy - most likely-multifactorial Evaluate for serotonin syndrome- likely Evaluate for myocardial infarction- likely Polypharmacy - likely Evaluate for stroke - possible but less likely given the clinical presentation and examination  Recommendations: -At  this time, I would recommend critical care/IM evaluation for admission for correction of these multiple toxic metabolic derangements. -Evaluation and workup for myocardial infarction per primary team. -MRI of the brain without contrast when able to to evaluate for stroke. -Gradual reduction in the sedating medications. -If clinical course does not improve despite or other tests and clinical measures being negative, consider doing an EEG to evaluate for seizures.  We will follow with you.   -- Amie Portland, MD [facility] [phone]  If 7pm to 7am, please call on call as listed on AMION  Critical care attestation This patient is critically ill and at significant risk of neurological worsening, death and care requires constant monitoring of vital signs, hemodynamics,respiratory and cardiac monitoring, neurological assessment, discussion with family, other specialists and medical decision making of high complexity. I spent 40 minutes of neurocritical care time  in the care of  this patient.

## 2016-11-21 NOTE — Progress Notes (Signed)
CRITICAL VALUE ALERT  Critical Value: Lactic Acid 2.5 Date & Time Notied:  11/29/2016 1945   Provider Notified: [facility]  Orders Received/Actions taken: paged

## 2016-11-21 NOTE — Consult Note (Signed)
PULMONARY / CRITICAL CARE MEDICINE   Name: Luke Montgomery MRN: [id number] DOB: July 12, 1925    ADMISSION DATE:  11/17/2016 CONSULTATION DATE:  11/15/2016  REFERRING MD:  Dr. Sherry Ruffing   CHIEF COMPLAINT:  AMS, Hypotension   HISTORY OF PRESENT ILLNESS:   83 y/o M with hx of advanced dementia (lives in a memory care unit), DVT/PE on Xarelto who presented to [facility] 8/21 from the memory care unit with reports of altered mental status.    At baseline, he is bed to wheelchair bound.  He has had frequent falls.  He also swings at staff. Reportedly in the ambulance, he briefly stopped breathing and required BVM.  He initially was evaluated as a code stroke with a CT of the head which was negative for acute bleeding.  He was found to have hypotension with BP in the 60's.  Initial labs - Na 149, K 4.5, Cl 114, CO2 18, glucose 154, BUN 35, Sr Cr 1.80, AG 17, troponin 1.28, lactic acid 7.07, WBC 10.6, Hgb 13.9 and platelets 87.  CXR showed possible RLL infiltrate vs atelectasis.  The patient was treated with IVF's with improvement in blood pressure.    Family reports he has not been eating as well in the last 24 hours.  No known fevers.  He has had a dry cough.  No known skin breakdown or wounds.   PCCM consulted for evaluation of possible sepsis.    PAST MEDICAL HISTORY :  Past Medical History:  Diagnosis Date  . Dementia   . DVT (deep venous thrombosis) ([place])   . Pulmonary embolism ([place])    PAST SURGICAL HISTORY: Past Surgical History:  Procedure Laterality Date  . HERNIA REPAIR    . ROTATOR CUFF REPAIR    . SMALL INTESTINE SURGERY     secondary to removal of recurrent polyp  . TRANSURETHRAL RESECTION OF PROSTATE      Allergies  Allergen Reactions  . Dilaudid [Hydromorphone Hcl] Other (See Comments)    Out of his mind. Extremely confused.   Marland Kitchen Penicillins Other (See Comments)    Has patient had a PCN reaction causing immediate rash, facial/tongue/throat swelling, SOB or lightheadedness with  hypotension: Yes Has patient had a PCN reaction causing severe rash involving mucus membranes or skin necrosis: Unknown Has patient had a PCN reaction that required hospitalization Unknown Has patient had a PCN reaction occurring within the last 10 years: Unknown above answers are "NO", then may proceed with Cephalosporin use.     No current facility-administered medications on file prior to encounter.    Current Outpatient Prescriptions on File Prior to Encounter  Medication Sig  . acetaminophen (MAPAP ARTHRITIS PAIN) 650 MG CR tablet Take 650 mg by mouth 3 (three) times daily.  Marland Kitchen acetaminophen (TYLENOL) 500 MG tablet Take 500 mg by mouth every 4 (four) hours as needed for mild pain or headache.  Marland Kitchen alum & mag hydroxide-simeth (MINTOX) 200-200-20 MG/5ML suspension Take 30 mLs by mouth as needed for indigestion or heartburn.  Marland Kitchen amantadine (SYMMETREL) 100 MG capsule Take 100 mg by mouth 2 (two) times daily.  . busPIRone (BUSPAR) 10 MG tablet Take 10 mg by mouth 2 (two) times daily with a meal.  . carbamazepine (TEGRETOL) 200 MG tablet Take 200 mg by mouth 2 (two) times daily with a meal.  . cholecalciferol (VITAMIN D) 1000 units tablet Take 1,000 Units by mouth daily.  . citalopram (CELEXA) 20 MG tablet Take 20 mg by mouth daily.  . divalproex (DEPAKOTE  SPRINKLE) 125 MG capsule Take 125 mg by mouth 2 (two) times daily.  Marland Kitchen donepezil (ARICEPT) 10 MG tablet Take 10 mg by mouth at bedtime.  Marland Kitchen guaifenesin (ROBAFEN) 100 MG/5ML syrup Take 200 mg by mouth every 6 (six) hours as needed for cough.  Marland Kitchen ibuprofen (ADVIL,MOTRIN) 800 MG tablet Take 800 mg by mouth every 8 (eight) hours as needed for mild pain.  Marland Kitchen loperamide (IMODIUM) 2 MG capsule Take 2 mg by mouth as needed for diarrhea or loose stools.  Marland Kitchen LORazepam (ATIVAN) 0.5 MG tablet Take 0.5 mg by mouth 3 (three) times daily.   . magnesium hydroxide (MILK OF MAGNESIA) 400 MG/5ML suspension Take 30 mLs by mouth daily as needed for mild constipation.   . Melatonin 1 MG TABS Take 1 mg by mouth at bedtime.   . memantine (NAMENDA XR) 14 MG CP24 24 hr capsule Take 14 mg by mouth daily.  Marland Kitchen neomycin-bacitracin-polymyxin (NEOSPORIN) ointment Apply 1 application topically as needed for wound care.  . nystatin (NYSTATIN) powder Apply 1 Bottle topically 2 (two) times daily as needed (groin rash).  . OLANZapine (ZYPREXA) 15 MG tablet Take 15 mg by mouth daily with breakfast.   . risperiDONE (RISPERDAL) 0.5 MG tablet Take 0.5 mg by mouth every 4 (four) hours as needed (for psychosis/agitation).  . rivaroxaban (XARELTO) 20 MG TABS tablet Take 20 mg by mouth daily with supper.  . tamsulosin (FLOMAX) 0.4 MG CAPS capsule Take 0.4 mg by mouth at bedtime.  . traZODone (DESYREL) 50 MG tablet Take 50 mg by mouth at bedtime.    FAMILY HISTORY:  Family History  Problem Relation Age of Onset  . Family history unknown: Yes    SOCIAL HISTORY: Social History   Social History  . Marital status: Married    Spouse name: N/A  . Number of children: N/A  . Years of education: N/A   Social History Main Topics  . Smoking status: Never Smoker  . Smokeless tobacco: Never Used  . Alcohol use No  . Drug use: No  . Sexual activity: Not Asked   Other Topics Concern  . None   Social History Narrative  . None    REVIEW OF SYSTEMS:  Unable to complete with patient due to advanced dementia, altered mental status.   VITAL SIGNS: BP (!) 87/59   Pulse (!) 42   Temp 98.9 F (37.2 C) (Rectal)   Resp (!) 21   Wt 159 lb 2.8 oz (72.2 kg)   SpO2 (!) 76%   BMI 23.51 kg/m   HEMODYNAMICS:    VENTILATOR SETTINGS:    INTAKE / OUTPUT: No intake/output data recorded.  PHYSICAL EXAMINATION: General: Frail elderly male. No distress.. Family at bedside. HEENT: Mucus membranes dry. Bilateral nasal trumpets in place. No scleral icterus. NRB mask in place. Neuro:  Eyes open. Not following commands. Spontaneously moving extremities. CV:  Regular rate. No  appreciable JVD. Pitting edema in lower extyremities. PULM:  Clear to auscultation. Normal work of breathing on nonrebreather mask. GI:  Soft. Nondistended. Normal bowel sounds. Grossly nontender. Extremities:  Cool. No cyanosis.  Skin:  Warm. Dry. No rash or ulcerations on visual inspection. Superficial, erythematous rash on lower extremities.    LABS:  BMET  Recent Labs Lab 11/04/2016 1300  NA 149*  K 4.5  CL 114*  CO2 18*  BUN 35*  CREATININE 1.80*  GLUCOSE 154*    Electrolytes  Recent Labs Lab 11/26/2016 1300  CALCIUM 9.1    CBC  Recent Labs Lab 11/26/2016 1300  WBC 10.6*  HGB 13.9  HCT 43.9  PLT 87*    Coag's  Recent Labs Lab 11/24/2016 1300  APTT 30  INR 1.31    Sepsis Markers  Recent Labs Lab 11/28/2016 1344  LATICACIDVEN 7.07*    ABG  Recent Labs Lab 11/09/2016 1412  PHART 7.376  PCO2ART 37.1  PO2ART 99.0    Liver Enzymes  Recent Labs Lab 11/08/2016 1300  AST 115*  ALT 83*  ALKPHOS 103  BILITOT 1.4*  ALBUMIN 3.4*    Cardiac Enzymes No results for input(s): TROPONINI, PROBNP in the last 168 hours.  Glucose No results for input(s): GLUCAP in the last 168 hours.  Imaging Dg Chest Portable 1 View  Result Date: 11/20/2016 CLINICAL DATA:  Hypoxia, altered mental status, unresponsive EXAM: PORTABLE CHEST 1 VIEW COMPARISON:  08/29/2016 FINDINGS: There is bilateral diffuse interstitial thickening. There is right lower lobe airspace disease concerning for pneumonia. There is no pleural effusion or pneumothorax. There is stable cardiomegaly. There is no acute osseous abnormality. There is loss of the normal acromion humeral distance bilaterally as can be seen with chronic rotator cuff tears. IMPRESSION: Mild CHF. Right lower lobe airspace disease concerning for pneumonia. Followup PA and lateral chest X-ray is recommended in 3-4 weeks following trial of antibiotic therapy to ensure resolution and exclude underlying malignancy. Electronically  Signed   By: Kathreen Devoid   On: 11/22/2016 13:55   Ct Head Code Stroke Wo Contrast  Result Date: 11/27/2016 CLINICAL DATA:  Code stroke. Patient unresponsive. Last seen normal at 11 a.m. EXAM: CT HEAD WITHOUT CONTRAST TECHNIQUE: Contiguous axial images were obtained from the base of the skull through the vertex without intravenous contrast. COMPARISON:  CT head, most recent, 10/11/2016. FINDINGS: Brain: No evidence for acute infarction, hemorrhage, mass lesion, hydrocephalus, or extra-axial fluid. Extensive atrophy. BILATERAL hypoattenuation of white matter, likely small vessel disease. Chronic appearing subcortical infarct RIGHT posterior frontal lobe, unchanged from priors. Vascular: Calcification of the cavernous internal carotid arteries consistent with cerebrovascular atherosclerotic disease. No signs of intracranial large vessel occlusion. Skull: Normal. Negative for fracture or focal lesion. Sinuses/Orbits: No acute finding. Other: None. ASPECTS [facility] Stroke Program Early CT Score) - Ganglionic level infarction (caudate, lentiform nuclei, internal capsule, insula, M1-M3 cortex): 7 - Supraganglionic infarction (M4-M6 cortex): 3 Total score (0-10 with 10 being normal): 10 IMPRESSION: 1. Atrophy and small vessel disease. No areas of acute infarction are identified. 2. ASPECTS is 10. These results were called by telephone at the time of interpretation on 11/26/2016 at 1:30 pm to Dr. Rory Percy , who verbally acknowledged these results. Electronically Signed   By: Staci Righter M.D.   On: 11/27/2016 13:38     STUDIES:  CT Head 8/21 >> negative for acute infarct   Port CXR 8/21 >> personally reviewed by me. Minimal rounded opacity right lower lung. No effusion appreciated. Lungs are well aerated.  CULTURES: BCx2 8/21 >>  UA 8/21 >>  UC 8/21 >>   ANTIBIOTICS: Vancomycin 8/21 >>  Levaquin 8/21 >>  Aztreonam 8/21 >>  SIGNIFICANT EVENTS: 8/21 - Presenting w/ hypoxia, hypotension, & acutely worsened  mental status  ASSESSMENT / PLAN:  83 y.o. male with a past medical history of advanced dementia and DVT/PE on Xarelto who presented 8/21 with altered mental status and hypotension.  Initially evaluated as a code stroke with a negative CT of the head. BP / mental status improved with IV fluids. Patient currently afebrile with no potential  sources identified. Given his chronic systemic anticoagulation recurrent DVT/PE is unlikely and along with that myocardial infarction.  1. Shock: Unclear etiology. Responding to IV fluids. Per discussion with family members at bedside patient is not a candidate for peripheral vasopressor infusion due to risk of limb ischemia and family/patient would not wish to undergo invasive procedures such as central venous catheter placement. Recommend continuing IV fluid as needed and as respiratory status allows. 2. Acute hypoxic respiratory failure: Likely multifactorial from patient's hypotension. Recommend continuing supplemental oxygen for patient comfort. 3. Possible sepsis: Unclear source. Doubt pneumonia. Recommend continuing broad-spectrum antibiotics while infectious workup continues. 4. Acute renal failure: Recommend attempting to maintain mean arterial pressure greater than 65. Further workup as per admitting service. 5. Lactic acidosis: Likely secondary to hypotension.  6. Disposition: Recommend admission to stepdown unit and hospitalist service. If patient does not continue to clinically improve or has further decline family would be amenable to palliation. 7. CODE STATUS: Confirmed full DO NOT RESUSCITATE/DO NOT INTUBATE with family members at bedside.  I have spent a total of 31 minutes of critical care time today caring for the patient, discussing plan of care with family at bedside, and reviewing the patient's electronic medical record.   Sonia Baller Ashok Cordia, M.D. [facility] Pulmonary & Critical Care Pager:  [phone] After 3pm or if no response, call  [phone] 11/19/2016, 2:32 PM

## 2016-11-21 NOTE — ED Triage Notes (Signed)
Pt presents to the ed with ems from [facility]. LSW at 1030, pt then became unresponsive. In route with ems the pt became apneic and required assisted ventilation.  Pt is on blood thinners and has a history of falls.  Pt not cleared for ct due to air way, pt taken back to room for evaluation. 2 mg of narcan given per vo dr tegeler.  Pt is now breathing around 30 times a minute and taken to ct by then rn, neurology, edp and emt

## 2016-11-21 NOTE — Code Documentation (Signed)
83yo male arriving to [facility] via [place] at 28.  Patient from nursing facility where he was found unresponsive, LKW 1030.  Patient with h/o dementia, DVT, PE and falls.  EMR indicates patient prescribed Xarelto, however, facility paperwork does not include Xarelto on MAR.  Stroke team at the bedside on patient arrival.  Patient receiving respiratory assistance with BVM by EMS on arrival.  EDP at the bedside.  Patient to A12. Patient with pinpoint pupils and given Narcan per MD order.  Patient hypotensive.  Patient placed on NRB.  NIHSS 26, see documentation for details and code stroke times.  Patient with non-purposeful movement in BUE and withdrawing to pain in BLE.  Patient to CT with team.  CT completed.  Patient transferred back to A12.  No acute stroke treatment at this time.  Code stroke canceled.  Bedside handoff with ED RN Loma Sousa.

## 2016-11-21 NOTE — ED Notes (Signed)
Pt remains arousable by minor stimulation with eyes open.  Pt still has nasal trumpets x 2 from ems in, per edp will leave them in place for now and continue to monitor. Pt remains on non-rebreather. Will ask about venti mask for patient

## 2016-11-21 NOTE — ED Notes (Signed)
edp aware of patients blood pressure

## 2016-11-21 NOTE — ED Provider Notes (Signed)
[facility] DEPT Provider Note   CSN: [id number] Arrival date & time: 11/28/2016  1259     History   Chief Complaint Chief Complaint  Patient presents with  . Code Stroke    HPI Luke Montgomery is a 83 y.o. male.  LVL 5 Caveat for AMS and unresponsiveness.   The history is provided by the EMS personnel, medical records and a relative.  Altered Mental Status   This is a new problem. The current episode started 3 to 5 hours ago. The problem has not changed since onset.Associated symptoms include unresponsiveness. Risk factors: cough yesterday. His past medical history does not include diabetes, seizures, hypertension, COPD or head trauma.    Past Medical History:  Diagnosis Date  . Dementia   . DVT (deep venous thrombosis) ([place])   . Pulmonary embolism [facility])     Patient Active Problem List   Diagnosis Date Noted  . HCAP (healthcare-associated pneumonia) 08/29/2016  . Hx of pulmonary embolus 08/29/2016  . Syncope 04/15/2016  . Dementia with behavioral disturbance 10/12/2015    Past Surgical History:  Procedure Laterality Date  . HERNIA REPAIR    . SMALL INTESTINE SURGERY     secondary to removal of recurrent polyp       Home Medications    Prior to Admission medications   Medication Sig Start Date End Date Taking? Authorizing Provider  acetaminophen (MAPAP ARTHRITIS PAIN) 650 MG CR tablet Take 650 mg by mouth 3 (three) times daily.    [provider]  acetaminophen (TYLENOL) 500 MG tablet Take 500 mg by mouth every 4 (four) hours as needed for mild pain or headache.    [provider]  alum & mag hydroxide-simeth (MINTOX) 200-200-20 MG/5ML suspension Take 30 mLs by mouth as needed for indigestion or heartburn.    [provider]  amantadine (SYMMETREL) 100 MG capsule Take 100 mg by mouth 2 (two) times daily.    [provider]  busPIRone (BUSPAR) 10 MG tablet Take 10 mg by mouth 2 (two) times daily with a meal.    [provider]  carbamazepine (TEGRETOL) 200 MG tablet Take 200 mg by mouth 2 (two) times daily with a meal.    [provider]  cholecalciferol (VITAMIN D) 1000 units tablet Take 1,000 Units by mouth daily.    [provider]  citalopram (CELEXA) 20 MG tablet Take 20 mg by mouth daily.    [provider]  divalproex (DEPAKOTE SPRINKLE) 125 MG capsule Take 125 mg by mouth 2 (two) times daily.    [provider]  donepezil (ARICEPT) 10 MG tablet Take 10 mg by mouth at bedtime.    [provider]  guaifenesin (ROBAFEN) 100 MG/5ML syrup Take 200 mg by mouth every 6 (six) hours as needed for cough.    [provider]  ibuprofen (ADVIL,MOTRIN) 800 MG tablet Take 800 mg by mouth every 8 (eight) hours as needed for mild pain.    [provider]  loperamide (IMODIUM) 2 MG capsule Take 2 mg by mouth as needed for diarrhea or loose stools.    [provider]  LORazepam (ATIVAN) 0.5 MG tablet Take 0.5 mg by mouth 3 (three) times daily.     [provider]  magnesium hydroxide (MILK OF MAGNESIA) 400 MG/5ML suspension Take 30 mLs by mouth daily as needed for mild constipation.    [provider]  Melatonin 1 MG TABS Take 1 mg by mouth at bedtime.  [provider]  memantine (NAMENDA XR) 14 MG CP24 24 hr capsule Take 14 mg by mouth daily.    [provider]  neomycin-bacitracin-polymyxin (NEOSPORIN) ointment Apply 1 application topically as needed for wound care.    [provider]  nystatin (NYSTATIN) powder Apply 1 Bottle topically 2 (two) times daily as needed (groin rash).    [provider]  OLANZapine (ZYPREXA) 15 MG tablet Take 15 mg by mouth daily with breakfast.     [provider]  risperiDONE (RISPERDAL) 0.5 MG tablet Take 0.5 mg by mouth every 4 (four) hours as needed (for psychosis/agitation).    [provider]  rivaroxaban (XARELTO) 20 MG TABS tablet  Take 20 mg by mouth daily with supper.    [provider]  tamsulosin (FLOMAX) 0.4 MG CAPS capsule Take 0.4 mg by mouth at bedtime.    [provider]  traZODone (DESYREL) 50 MG tablet Take 50 mg by mouth at bedtime.    [provider]    Family History Family History  Problem Relation Age of Onset  . Family history unknown: Yes    Social History Social History  Substance Use Topics  . Smoking status: Never Smoker  . Smokeless tobacco: Never Used  . Alcohol use No     Allergies   Dilaudid [hydromorphone hcl] and Penicillins   Review of Systems Review of Systems  Unable to perform ROS: Patient unresponsive  Respiratory: Positive for cough (per daughter).      Physical Exam Updated Vital Signs BP (!) 96/49   Pulse (!) 110   Resp (!) 30   SpO2 94%   Physical Exam  Constitutional: He appears well-developed and well-nourished. He appears distressed.  HENT:  Head: Normocephalic.  Mouth/Throat: Oropharynx is clear and moist. No oropharyngeal exudate.  Eyes: Pupils are equal, round, and reactive to light. Conjunctivae are normal.  Cardiovascular: Normal rate and intact distal pulses.   No murmur heard. Pulmonary/Chest: No stridor. Bradypnea noted. He has rhonchi. He exhibits no tenderness.  Abdominal: There is no tenderness.  Neurological: He is unresponsive. He displays tremor. He displays no seizure activity. GCS eye subscore is 2. GCS verbal subscore is 1. GCS motor subscore is 4.  Skin: Capillary refill takes less than 2 seconds. He is not diaphoretic. No erythema. No pallor.  Nursing note and vitals reviewed.    ED Treatments / Results  Labs (all labs ordered are listed, but only abnormal results are displayed) Labs Reviewed  PROTIME-INR - Abnormal; Notable for the following:       Result Value   Prothrombin Time 16.4 (*)    All other components within normal limits  CBC - Abnormal; Notable for the following:    WBC 10.6 (*)     Platelets 87 (*)    All other components within normal limits  DIFFERENTIAL - Abnormal; Notable for the following:    Neutro Abs 7.8 (*)    All other components within normal limits  COMPREHENSIVE METABOLIC PANEL - Abnormal; Notable for the following:    Sodium 149 (*)    Chloride 114 (*)    CO2 18 (*)    Glucose, Bld 154 (*)    BUN 35 (*)    Creatinine, Ser 1.80 (*)    Total Protein 6.2 (*)    Albumin 3.4 (*)    AST 115 (*)    ALT 83 (*)    Total Bilirubin 1.4 (*)    GFR calc non Af Wyvonnia Lora  32 (*)    GFR calc Af Amer 37 (*)    Anion gap 17 (*)    All other components within normal limits  URINALYSIS, ROUTINE W REFLEX MICROSCOPIC - Abnormal; Notable for the following:    Color, Urine AMBER (*)    All other components within normal limits  LACTIC ACID, PLASMA - Abnormal; Notable for the following:    Lactic Acid, Venous 2.0 (*)    All other components within normal limits  DIC (DISSEMINATED INTRAVASCULAR COAGULATION) PANEL - Abnormal; Notable for the following:    Prothrombin Time 17.9 (*)    D-Dimer, Quant >20.00 (*)    Platelets 71 (*)    All other components within normal limits  I-STAT TROPONIN, ED - Abnormal; Notable for the following:    Troponin i, poc 1.28 (*)    All other components within normal limits  I-STAT CG4 LACTIC ACID, ED - Abnormal; Notable for the following:    Lactic Acid, Venous 7.07 (*)    All other components within normal limits  I-STAT ARTERIAL BLOOD GAS, ED - Abnormal; Notable for the following:    Acid-base deficit 3.0 (*)    All other components within normal limits  I-STAT CG4 LACTIC ACID, ED - Abnormal; Notable for the following:    Lactic Acid, Venous 1.98 (*)    All other components within normal limits  URINE CULTURE  CULTURE, BLOOD (ROUTINE X 2)  CULTURE, BLOOD (ROUTINE X 2)  APTT  PROCALCITONIN  SAVE SMEAR  LACTIC ACID, PLASMA  COMPREHENSIVE METABOLIC PANEL  CBC  PROTIME-INR  STREP PNEUMONIAE URINARY ANTIGEN  CBG MONITORING, ED    I-STAT CHEM 8, ED    EKG  EKG Interpretation  Date/Time:  Tuesday November 21 2016 13:11:00 EDT Ventricular Rate:  103 PR Interval:    QRS Duration: 96 QT Interval:  357 QTC Calculation: 468 R Axis:   91 Text Interpretation:  Sinus tachycardia Ventricular premature complex Aberrant conduction of SV complex(es) Biatrial enlargement Probable RVH w/ secondary repol abnormality Repol abnrm, severe global ischemia (LM/MVD) Baseline wander in lead(s) V1 V2 Wandering baseline. Will repeat.  Concern for ischemia.  Confirmed by Antony Blackbird [phone]) on 11/30/2016 1:25:31 PM       Radiology Dg Chest Portable 1 View  Result Date: 11/19/2016 CLINICAL DATA:  Hypoxia, altered mental status, unresponsive EXAM: PORTABLE CHEST 1 VIEW COMPARISON:  08/29/2016 FINDINGS: There is bilateral diffuse interstitial thickening. There is right lower lobe airspace disease concerning for pneumonia. There is no pleural effusion or pneumothorax. There is stable cardiomegaly. There is no acute osseous abnormality. There is loss of the normal acromion humeral distance bilaterally as can be seen with chronic rotator cuff tears. IMPRESSION: Mild CHF. Right lower lobe airspace disease concerning for pneumonia. Followup PA and lateral chest X-ray is recommended in 3-4 weeks following trial of antibiotic therapy to ensure resolution and exclude underlying malignancy. Electronically Signed   By: Kathreen Devoid   On: 11/30/2016 13:55   Ct Head Code Stroke Wo Contrast  Result Date: 11/20/2016 CLINICAL DATA:  Code stroke. Patient unresponsive. Last seen normal at 11 a.m. EXAM: CT HEAD WITHOUT CONTRAST TECHNIQUE: Contiguous axial images were obtained from the base of the skull through the vertex without intravenous contrast. COMPARISON:  CT head, most recent, 10/11/2016. FINDINGS: Brain: No evidence for acute infarction, hemorrhage, mass lesion, hydrocephalus, or extra-axial fluid. Extensive atrophy. BILATERAL hypoattenuation of white  matter, likely small vessel disease. Chronic appearing subcortical infarct RIGHT posterior frontal lobe, unchanged from  priors. Vascular: Calcification of the cavernous internal carotid arteries consistent with cerebrovascular atherosclerotic disease. No signs of intracranial large vessel occlusion. Skull: Normal. Negative for fracture or focal lesion. Sinuses/Orbits: No acute finding. Other: None. ASPECTS [facility] Stroke Program Early CT Score) - Ganglionic level infarction (caudate, lentiform nuclei, internal capsule, insula, M1-M3 cortex): 7 - Supraganglionic infarction (M4-M6 cortex): 3 Total score (0-10 with 10 being normal): 10 IMPRESSION: 1. Atrophy and small vessel disease. No areas of acute infarction are identified. 2. ASPECTS is 10. These results were called by telephone at the time of interpretation on 11/22/2016 at 1:30 pm to Dr. Rory Percy , who verbally acknowledged these results. Electronically Signed   By: Staci Righter M.D.   On: 11/30/2016 13:38    Procedures Procedures (including critical care time)  CRITICAL CARE Performed by: Gwenyth Allegra Saajan Willmon Total critical care time: 45 minutes Critical care time was exclusive of separately billable procedures and treating other patients. Critical care was necessary to treat or prevent imminent or life-threatening deterioration. Critical care was time spent personally by me on the following activities: development of treatment plan with patient and/or surrogate as well as nursing, discussions with consultants, evaluation of patient's response to treatment, examination of patient, obtaining history from patient or surrogate, ordering and performing treatments and interventions, ordering and review of laboratory studies, ordering and review of radiographic studies, pulse oximetry and re-evaluation of patient's condition.   Medications Ordered in ED Medications  vancomycin (VANCOCIN) IVPB 750 mg/150 ml premix (not administered)  levofloxacin  (LEVAQUIN) IVPB 500 mg (not administered)  aztreonam (AZACTAM) 1 g in dextrose 5 % 50 mL IVPB (not administered)  0.9 %  sodium chloride infusion (not administered)  acetaminophen (TYLENOL) tablet 650 mg (not administered)    Or  acetaminophen (TYLENOL) suppository 650 mg (not administered)  senna-docusate (Senokot-S) tablet 1 tablet (not administered)  bisacodyl (DULCOLAX) suppository 10 mg (not administered)  ondansetron (ZOFRAN) tablet 4 mg (not administered)    Or  ondansetron (ZOFRAN) injection 4 mg (not administered)  magnesium hydroxide (MILK OF MAGNESIA) suspension 30 mL (not administered)  memantine (NAMENDA XR) 24 hr capsule 14 mg (not administered)  amantadine (SYMMETREL) capsule 100 mg (not administered)  busPIRone (BUSPAR) tablet 10 mg (10 mg Oral Not Given 11/20/2016 1715)  carbamazepine (TEGRETOL) tablet 200 mg (200 mg Oral Not Given 11/22/2016 1715)  citalopram (CELEXA) tablet 20 mg (not administered)  LORazepam (ATIVAN) tablet 0.5 mg (not administered)  risperiDONE (RISPERDAL) tablet 0.5 mg (not administered)  traZODone (DESYREL) tablet 50 mg (not administered)  donepezil (ARICEPT) tablet 10 mg (not administered)  tamsulosin (FLOMAX) capsule 0.4 mg (not administered)  MEDLINE mouth rinse (not administered)  naloxone (NARCAN) 2 MG/2ML injection (2 mg  Given 11/23/2016 1315)  sodium chloride 0.9 % bolus 1,000 mL (0 mLs Intravenous Stopped 11/02/2016 1458)  levofloxacin (LEVAQUIN) IVPB 750 mg (0 mg Intravenous Stopped 11/23/2016 1558)  aztreonam (AZACTAM) 2 g in dextrose 5 % 50 mL IVPB (0 g Intravenous Stopped 11/04/2016 1458)  vancomycin (VANCOCIN) 1,500 mg in sodium chloride 0.9 % 500 mL IVPB (0 mg Intravenous Stopped 11/10/2016 1710)  sodium chloride 0.9 % bolus 1,000 mL (0 mLs Intravenous Stopped 11/27/2016 1434)     Initial Impression / Assessment and Plan / ED Course  I have reviewed the triage vital signs and the nursing notes.  Pertinent labs & imaging results that were available  during my care of the patient were reviewed by me and considered in my medical decision making (see chart for  details).     Luke Montgomery is a 83 y.o. male With a past medical history significant for pulmonary embolism on Xarelto and dementia who presents from a nursing facility for altered mental status and respiratory distress. According to EMS, patient had sudden onset altered mental status and decrease in breathing. Patient found to be hypoxic and having minimal respiratory drive. Patient was being bagged with Ambu bag during transport. Patient only responsive to pain. EMS denies any recent narcotic administration and no recent injuries.  On arrival, patient was found to have do not resuscitate paperwork. GCS found to be seven and patient would normally be intubated however due to advanced directives, patient will be continued on non-rebreather. Patient began to breathe on his own allowing oxygen to be in the upper 80s and low 90s.  Neurology evaluated the patient and did not find any focal localizing deficits. Patient had pinpoint pupils and was given Narcan without change in symptoms. Patient would respond to pain and honest remedies. No gaze preference. No blown pupils. Patient taken to CT scanner with no evidence of acute infarction.  Patient found to have persistent hypotension and was given fluids. Family arrived reporting that patient has had recent cough. Lactic acid returned greater than seven. Suspect pneumonia with sepsis.  Family says that they want patient treated with antibiotics but he will remained DNR.  Patient also found to have ST depressions on EKG. Troponin positive. Cardiology called and felt it was related to his demand ischemia and infection.  Critical-care team called and fell patient was appropriate for step down unit with hospitalist team managing. Patient will be continued on antibiotics and fluids for pneumonia with sepsis. Family advised that patient is in dire  condition with multisystem organ failure. Family aware that patient may not survive this encounter.  Patient responded to fluids with improved blood pressure. Patient had no other complications and was admitted to step down unit for further management of infection, altered mental status, and multisystem organ failure.   Final Clinical Impressions(s) / ED Diagnoses   Final diagnoses:  Altered mental status, unspecified altered mental status type  Acute respiratory failure with hypoxia (HCC)  Elevated troponin  Sepsis due to pneumonia (HCC)  Hypotension, unspecified hypotension type  Lactic acidosis    Clinical Impression: 1. Altered mental status, unspecified altered mental status type   2. Sepsis ([place])   3. Acute respiratory failure with hypoxia (HCC)   4. Elevated troponin   5. Sepsis due to pneumonia ([place])   6. Hypotension, unspecified hypotension type   7. Lactic acidosis     Disposition: Admit to Hospitalist service    [place], Gwenyth Allegra, MD 11/19/2016 1944

## 2016-11-21 NOTE — ED Notes (Signed)
Pt is waking up more now, responding to this rn and family. Will continue to monitor. Waiting vanc to arrive from pharmacy

## 2016-11-21 NOTE — Progress Notes (Signed)
Pharmacy Antibiotic Note  Luke Montgomery is a 83 y.o. male admitted on 11/23/2016 with sepsis.  Pharmacy has been consulted for vancomycin, levaquin and aztreonam dosing. Pt is afebrile and WBC is 10.6. SCr is elevated at 1.8. Lactic acid >7.  Plan: Vancomycin 1500mg  IV x 1 then 750mg  IV Q24H Aztreonam 2gm IV x 1 then 1gm IV Q8H Levaquin 750mg  IV x 1 then 500mg  IV Q48H F/u renal fxn, C&S, clinical status and trough at SS  Weight: 159 lb 2.8 oz (72.2 kg)  Temp (24hrs), Avg:98.9 F (37.2 C), Min:98.9 F (37.2 C), Max:98.9 F (37.2 C)   Recent Labs Lab 11/27/2016 1300 11/24/2016 1344  WBC 10.6*  --   CREATININE 1.80*  --   LATICACIDVEN  --  7.07*    Estimated Creatinine Clearance: 28.4 mL/min (A) (by C-G formula based on SCr of 1.8 mg/dL (H)).    Allergies  Allergen Reactions  . Dilaudid [Hydromorphone Hcl] Other (See Comments)    Out of his mind. Extremely confused.   Marland Kitchen Penicillins Other (See Comments)    Has patient had a PCN reaction causing immediate rash, facial/tongue/throat swelling, SOB or lightheadedness with hypotension: Yes Has patient had a PCN reaction causing severe rash involving mucus membranes or skin necrosis: Unknown Has patient had a PCN reaction that required hospitalization Unknown Has patient had a PCN reaction occurring within the last 10 years: Unknown above answers are "NO", then may proceed with Cephalosporin use.     Antimicrobials this admission: Vanc 8/21>> Levaquin 8/21>> Aztreo 8/21>>  Dose adjustments this admission: N/A  Microbiology results: Pending  Thank you for allowing pharmacy to be a part of this patient's care.  Tinzlee Craker, Rande Lawman 11/15/2016 1:58 PM

## 2016-11-21 NOTE — H&P (Signed)
History and Physical    Luke Montgomery ZJQ:[id number] DOB: 07-23-1925 DOA: 11/22/2016   PCP: Deland Pretty, MD   Patient coming from:  Home    Chief Complaint: Confusion  LEvel V Caveat due to confusion  HPI: Luke Montgomery is a 83 y.o. male with medical history significant for advanced dementia, leaving in the memory care unit, history of DVT and PE on Xarelto, brought to the ED with acute altered mental status. He is wheelchair bound, and has frequent falls.On the EMS transport he briefly stopped breathing, requiring ventilation mask. He initially was evaluated as a code stroke, but a CT of the head was negative for acute findings or  Bleeding. He was also hypotensive with blood pressure in the 60s. He is DNR/DNI and family request no pressors. In addition, he was found to have a troponin of 1.28, for which cardiology was contacted, but no indication for aggressive measures were recommended. At the ED, he was given IV fluids, with improvement of his blood pressure, and further work up followed. History is obtained by family, as patient is very confused in the setting of dementia.. After history cannot be obtained due to the patient's condition. Family reports that he is more confused than his baseline. He denied appear to have any cardiac or respiratory issues prior to these admission, and they are not a waiver of any sick contacts. Patient is very weak.  ED Course:  BP 98/66   Pulse (!) 103   Temp 98.9 F (37.2 C) (Rectal)   Resp (!) 21   Wt 72.2 kg (159 lb 2.8 oz)   SpO2 100%   BMI 23.51 kg/m   CT head head negative  sodium 149 potassium 4.5 chloride 114 CO2 18  glucose 154 creatinine 1.8,  AG 17  troponin 1.28  lactic acid 7.07  white count 10.6 hemoglobin 13.9  platelets 87,000 chest x-ray possible RLL infiltrate receiving IV vancomycin, IV Levaquin and IV Azactam  Review of Systems:  As per HPI otherwise all other systems reviewed and are negative  Past Medical History:    Diagnosis Date  . Dementia   . DVT (deep venous thrombosis) ([place])   . Pulmonary embolism [facility])     Past Surgical History:  Procedure Laterality Date  . HERNIA REPAIR    . ROTATOR CUFF REPAIR    . SMALL INTESTINE SURGERY     secondary to removal of recurrent polyp  . TRANSURETHRAL RESECTION OF PROSTATE      Social History Social History   Social History  . Marital status: Married    Spouse name: N/A  . Number of children: N/A  . Years of education: N/A   Occupational History  . Not on file.   Social History Main Topics  . Smoking status: Never Smoker  . Smokeless tobacco: Never Used  . Alcohol use No  . Drug use: No  . Sexual activity: Not on file   Other Topics Concern  . Not on file   Social History Narrative  . No narrative on file     Allergies  Allergen Reactions  . Dilaudid [Hydromorphone Hcl] Other (See Comments)    Out of his mind. Extremely confused.   Marland Kitchen Penicillins Other (See Comments)    Has patient had a PCN reaction causing immediate rash, facial/tongue/throat swelling, SOB or lightheadedness with hypotension: Yes Has patient had a PCN reaction causing severe rash involving mucus membranes or skin necrosis: Unknown Has patient had a PCN reaction that  required hospitalization Unknown Has patient had a PCN reaction occurring within the last 10 years: Unknown above answers are "NO", then may proceed with Cephalosporin use.     Family History  Problem Relation Age of Onset  . Family history unknown: Yes      Prior to Admission medications   Medication Sig Start Date End Date Taking? Authorizing Provider  acetaminophen (MAPAP ARTHRITIS PAIN) 650 MG CR tablet Take 650 mg by mouth 3 (three) times daily.    [provider]  acetaminophen (TYLENOL) 500 MG tablet Take 500 mg by mouth every 4 (four) hours as needed for mild pain or headache.    [provider]  alum & mag hydroxide-simeth (MINTOX) 200-200-20 MG/5ML suspension Take  30 mLs by mouth as needed for indigestion or heartburn.    [provider]  amantadine (SYMMETREL) 100 MG capsule Take 100 mg by mouth 2 (two) times daily.    [provider]  busPIRone (BUSPAR) 10 MG tablet Take 10 mg by mouth 2 (two) times daily with a meal.    [provider]  carbamazepine (TEGRETOL) 200 MG tablet Take 200 mg by mouth 2 (two) times daily with a meal.    [provider]  cholecalciferol (VITAMIN D) 1000 units tablet Take 1,000 Units by mouth daily.    [provider]  citalopram (CELEXA) 20 MG tablet Take 20 mg by mouth daily.    [provider]  divalproex (DEPAKOTE SPRINKLE) 125 MG capsule Take 125 mg by mouth 2 (two) times daily.    [provider]  donepezil (ARICEPT) 10 MG tablet Take 10 mg by mouth at bedtime.    [provider]  guaifenesin (ROBAFEN) 100 MG/5ML syrup Take 200 mg by mouth every 6 (six) hours as needed for cough.    [provider]  ibuprofen (ADVIL,MOTRIN) 800 MG tablet Take 800 mg by mouth every 8 (eight) hours as needed for mild pain.    [provider]  loperamide (IMODIUM) 2 MG capsule Take 2 mg by mouth as needed for diarrhea or loose stools.    [provider]  LORazepam (ATIVAN) 0.5 MG tablet Take 0.5 mg by mouth 3 (three) times daily.     [provider]  magnesium hydroxide (MILK OF MAGNESIA) 400 MG/5ML suspension Take 30 mLs by mouth daily as needed for mild constipation.    [provider]  Melatonin 1 MG TABS Take 1 mg by mouth at bedtime.     [provider]  memantine (NAMENDA XR) 14 MG CP24 24 hr capsule Take 14 mg by mouth daily.    [provider]  neomycin-bacitracin-polymyxin (NEOSPORIN) ointment Apply 1 application topically as needed for wound care.    [provider]  nystatin (NYSTATIN) powder Apply 1 Bottle topically 2 (two) times daily as needed (groin rash).    [provider]    OLANZapine (ZYPREXA) 15 MG tablet Take 15 mg by mouth daily with breakfast.     [provider]  risperiDONE (RISPERDAL) 0.5 MG tablet Take 0.5 mg by mouth every 4 (four) hours as needed (for psychosis/agitation).    [provider]  rivaroxaban (XARELTO) 20 MG TABS tablet Take 20 mg by mouth daily with supper.    [provider]  tamsulosin (FLOMAX) 0.4 MG CAPS capsule Take 0.4 mg by mouth at bedtime.    [provider]  traZODone (DESYREL) 50 MG tablet Take 50 mg by mouth at bedtime.  [provider]    Physical Exam:  Vitals:   11/22/2016 1530 11/20/2016 1545 11/24/2016 1600 11/10/2016 1615  BP: (!) 85/62 (!) 102/59 (!) 96/48 98/66  Pulse:    (!) 103  Resp: (!) 23 (!) 30 (!) 22 (!) 21  Temp:      TempSrc:      SpO2:    100%  Weight:       Constitutional: very confused, NAD, unable to interact Eyes: PERRL, lids and conjunctivae normal ENMT: Mucous membranes are moist, without exudate or lesions  Neck: normal, supple, no masses, no thyromegaly Respiratory: decreased breath sounds on the R anteriorly , no wheezing, no crackles. Normal respiratory effort  Cardiovascular: Regular rate and rhythm, no murmurs, rubs or gallops. No extremity edema. 2+ pedal pulses. No carotid bruits.  Abdomen: Soft, protuberant non tender, No hepatosplenomegaly. Bowel sounds positive.  Musculoskeletal: no clubbing / cyanosis. Moves all extremities Skin: no jaundice, No lesions. Some petechia noted on both lower extremities, no open lesions.  Neurologic: moves extremities, but unable to interact or follow commands, does not respond to verbal stimulus, but to touch  Psychiatric:   Alert and oriented x 3. Normal mood.     Labs on Admission: I have personally reviewed following labs and imaging studies  CBC:  Recent Labs Lab 11/16/2016 1300  WBC 10.6*  NEUTROABS 7.8*  HGB 13.9  HCT 43.9  MCV 95.4  PLT 87*    Basic Metabolic Panel:  Recent Labs Lab  11/28/2016 1300  NA 149*  K 4.5  CL 114*  CO2 18*  GLUCOSE 154*  BUN 35*  CREATININE 1.80*  CALCIUM 9.1    GFR: Estimated Creatinine Clearance: 28.4 mL/min (A) (by C-G formula based on SCr of 1.8 mg/dL (H)).  Liver Function Tests:  Recent Labs Lab 11/18/2016 1300  AST 115*  ALT 83*  ALKPHOS 103  BILITOT 1.4*  PROT 6.2*  ALBUMIN 3.4*   No results for input(s): LIPASE, AMYLASE in the last 168 hours. No results for input(s): AMMONIA in the last 168 hours.  Coagulation Profile:  Recent Labs Lab 11/29/2016 1300  INR 1.31    Cardiac Enzymes: No results for input(s): CKTOTAL, CKMB, CKMBINDEX, TROPONINI in the last 168 hours.  BNP (last 3 results) No results for input(s): PROBNP in the last 8760 hours.  HbA1C: No results for input(s): HGBA1C in the last 72 hours.  CBG: No results for input(s): GLUCAP in the last 168 hours.  Lipid Profile: No results for input(s): CHOL, HDL, LDLCALC, TRIG, CHOLHDL, LDLDIRECT in the last 72 hours.  Thyroid Function Tests: No results for input(s): TSH, T4TOTAL, FREET4, T3FREE, THYROIDAB in the last 72 hours.  Anemia Panel: No results for input(s): VITAMINB12, FOLATE, FERRITIN, TIBC, IRON, RETICCTPCT in the last 72 hours.  Urine analysis:    Component Value Date/Time   COLORURINE AMBER (A) 11/24/2016 1356   APPEARANCEUR CLEAR 11/20/2016 1356   LABSPEC 1.023 11/05/2016 1356   PHURINE 5.0 11/15/2016 1356   GLUCOSEU NEGATIVE 12/01/2016 1356   HGBUR NEGATIVE 11/04/2016 1356   [place] 11/16/2016 1356   [place] 11/10/2016 1356   PROTEINUR NEGATIVE 11/17/2016 1356   NITRITE NEGATIVE 12/01/2016 1356   LEUKOCYTESUR NEGATIVE 11/05/2016 1356    Sepsis Labs: @LABRCNTIP (procalcitonin:4,lacticidven:4) )No results found for this or any previous visit (from the past 240 hour(s)).   Radiological Exams on Admission: Dg Chest Portable 1 View  Result Date: 11/27/2016 CLINICAL DATA:  Hypoxia, altered mental status,  unresponsive EXAM: PORTABLE CHEST  1 VIEW COMPARISON:  08/29/2016 FINDINGS: There is bilateral diffuse interstitial thickening. There is right lower lobe airspace disease concerning for pneumonia. There is no pleural effusion or pneumothorax. There is stable cardiomegaly. There is no acute osseous abnormality. There is loss of the normal acromion humeral distance bilaterally as can be seen with chronic rotator cuff tears. IMPRESSION: Mild CHF. Right lower lobe airspace disease concerning for pneumonia. Followup PA and lateral chest X-ray is recommended in 3-4 weeks following trial of antibiotic therapy to ensure resolution and exclude underlying malignancy. Electronically Signed   By: Kathreen Devoid   On: 11/08/2016 13:55   Ct Head Code Stroke Wo Contrast  Result Date: 11/28/2016 CLINICAL DATA:  Code stroke. Patient unresponsive. Last seen normal at 11 a.m. EXAM: CT HEAD WITHOUT CONTRAST TECHNIQUE: Contiguous axial images were obtained from the base of the skull through the vertex without intravenous contrast. COMPARISON:  CT head, most recent, 10/11/2016. FINDINGS: Brain: No evidence for acute infarction, hemorrhage, mass lesion, hydrocephalus, or extra-axial fluid. Extensive atrophy. BILATERAL hypoattenuation of white matter, likely small vessel disease. Chronic appearing subcortical infarct RIGHT posterior frontal lobe, unchanged from priors. Vascular: Calcification of the cavernous internal carotid arteries consistent with cerebrovascular atherosclerotic disease. No signs of intracranial large vessel occlusion. Skull: Normal. Negative for fracture or focal lesion. Sinuses/Orbits: No acute finding. Other: None. ASPECTS [facility] Stroke Program Early CT Score) - Ganglionic level infarction (caudate, lentiform nuclei, internal capsule, insula, M1-M3 cortex): 7 - Supraganglionic infarction (M4-M6 cortex): 3 Total score (0-10 with 10 being normal): 10 IMPRESSION: 1. Atrophy and small vessel disease. No areas of acute  infarction are identified. 2. ASPECTS is 10. These results were called by telephone at the time of interpretation on 11/22/2016 at 1:30 pm to Dr. Rory Percy , who verbally acknowledged these results. Electronically Signed   By: Staci Righter M.D.   On: 11/05/2016 13:38    EKG: Independently reviewed.  Assessment/Plan Active Problems:   Sepsis ([place])   Dementia with behavioral disturbance   Syncope   HCAP (healthcare-associated pneumonia)   Hx of pulmonary embolus   BPH (benign prostatic hyperplasia)   Confusion   Hypotension   Thrombocytopenia ([facility])    Sepsis likely due to  respiratory source ?HCAP  organism unknown Patient meets criteria given tachypnea  leukocytosis, and evidence of organ dysfunction, hypotension . Lactic acid 7  Antibiotics delivered in the ED with Vanc, Azactam and Levaquin. He received aggressive IVF . UA neg Afebrile  Admit to SDU Sepsis order set  IV antibiotics by pharmacy with Vanc, Azactam and Levaquin Follow lactic acid  Follow blood  cultures IV fluids at 100 cc/h.  Procalcitonin order set   Confusion in the setting of sepsis, CT head neg. History of dementia WIll continue to closely monitor.  Of note, Narcan was used for pinpoint pupils unknown etiology, with good response     Acute Respiratory Failure with Hypoxia likely due to HCAP, complicated by one episode of apnea, requiring mask breathing .Afebrile. No apparent cough or increasing SOB prior to admission. Incidental finding on CXR RLL.   WBC 10.2   Lactic acid  7 Bicarb 18  Oxygen , continue NRB for now  IV antibiotics  l as above  Procalcitonin   Nebulizers as needed, with   Mucinex prn  IV fluids Repeat CBC in am   Acute on Chronic kidney disease stage  2   0.8, currently at 1.8 , currently receiving IVF .   Lab Results  Component Value Date  CREATININE 1.80 (H) 12/01/2016   CREATININE 0.71 08/30/2016   CREATININE 0.80 08/29/2016  IVF Hold diuretics  Monitor closely as he is on IV  antibiotics  Repeat CMET in am     Hypotension:in the setting of sepsis Receiving aggressive IVF. appropriate urine output).Family declines use of pressors  Continue IVF, hold nay BP meds  Close monitoring of BP in SDU   Elevated Troponin The patient is asymptomatic. likely due to demand ischemia secondary sepsis, EKG with Afib Cardiology evaluated patient and no aggressive measures were indicated . Echo pending  Repeat Tn and EKG  no other intervention is indicated at this time    Benign prostatic hypertrophy, no acute issues Continue Flomax in view of need for aggressive hydration   Thrombocytopenia Likely due to dilution, infection . Patient on chronic XareltoPetechia noted on both lower extremities. This appears to be acute  No transfusion is indicated at this time Monitor counts closely Check DIC and smear  Transfuse 1 unit of platelets if count is less or equal than 10,000 or 20,000 if the patient is acutely bleeding  History of PE, DVT on Xarelto Continue med for now unless DIC or hemolysis is found  Demnentia   Dementia Continue Namenda     DVT prophylaxis:  Xarelto  Code Status: DNR Family Communication:  Discussed with patient Disposition Plan: Expect patient to be discharged to home after condition improves Consults called:   Neuro and Cards per EDP  Admission status SDU    [facility] E, PA-C [facility]   11/27/2016, 4:27 PM

## 2016-11-21 NOTE — ED Notes (Signed)
Family at bedside. 

## 2016-11-21 NOTE — Discharge Planning (Signed)
Possible disposition home with home health; [facility] has contract with [facility].  Adacia with [facility] notified of pt admission. Patterson Hollenbaugh J. Clydene Laming, [place], [place], [place]

## 2016-11-22 ENCOUNTER — Inpatient Hospital Stay (HOSPITAL_COMMUNITY): Payer: Medicare PPO

## 2016-11-22 DIAGNOSIS — J9601 Acute respiratory failure with hypoxia: Secondary | ICD-10-CM

## 2016-11-22 DIAGNOSIS — R7989 Other specified abnormal findings of blood chemistry: Secondary | ICD-10-CM

## 2016-11-22 DIAGNOSIS — G9341 Metabolic encephalopathy: Secondary | ICD-10-CM

## 2016-11-22 DIAGNOSIS — I5030 Unspecified diastolic (congestive) heart failure: Secondary | ICD-10-CM

## 2016-11-22 DIAGNOSIS — R627 Adult failure to thrive: Secondary | ICD-10-CM

## 2016-11-22 LAB — PROTIME-INR
INR: 1.45
PROTHROMBIN TIME: 17.8 s — AB (ref 11.4–15.2)

## 2016-11-22 LAB — COMPREHENSIVE METABOLIC PANEL
ALT: 207 U/L — ABNORMAL HIGH (ref 17–63)
ANION GAP: 7 (ref 5–15)
AST: 253 U/L — ABNORMAL HIGH (ref 15–41)
Albumin: 2.7 g/dL — ABNORMAL LOW (ref 3.5–5.0)
Alkaline Phosphatase: 82 U/L (ref 38–126)
BUN: 32 mg/dL — ABNORMAL HIGH (ref 6–20)
CALCIUM: 7.9 mg/dL — AB (ref 8.9–10.3)
CHLORIDE: 121 mmol/L — AB (ref 101–111)
CO2: 24 mmol/L (ref 22–32)
CREATININE: 1.23 mg/dL (ref 0.61–1.24)
GFR, EST AFRICAN AMERICAN: 58 mL/min — AB (ref >60)
GFR, EST NON AFRICAN AMERICAN: 50 mL/min — AB (ref >60)
Glucose, Bld: 106 mg/dL — ABNORMAL HIGH (ref 65–99)
Potassium: 3.6 mmol/L (ref 3.5–5.1)
SODIUM: 152 mmol/L — AB (ref 135–145)
Total Bilirubin: 0.7 mg/dL (ref 0.3–1.2)
Total Protein: 4.9 g/dL — ABNORMAL LOW (ref 6.5–8.1)

## 2016-11-22 LAB — CBC
HCT: 38.5 % — ABNORMAL LOW (ref 39.0–52.0)
HEMOGLOBIN: 12 g/dL — AB (ref 13.0–17.0)
MCH: 30.2 pg (ref 26.0–34.0)
MCHC: 31.2 g/dL (ref 30.0–36.0)
MCV: 96.7 fL (ref 78.0–100.0)
PLATELETS: 81 10*3/uL — AB (ref 150–400)
RBC: 3.98 MIL/uL — AB (ref 4.22–5.81)
RDW: 15 % (ref 11.5–15.5)
WBC: 8 10*3/uL (ref 4.0–10.5)

## 2016-11-22 LAB — STREP PNEUMONIAE URINARY ANTIGEN: STREP PNEUMO URINARY ANTIGEN: NEGATIVE

## 2016-11-22 LAB — URINE CULTURE: CULTURE: NO GROWTH

## 2016-11-22 LAB — MRSA PCR SCREENING: MRSA BY PCR: NEGATIVE

## 2016-11-22 MED ORDER — DEXTROSE-NACL 5-0.45 % IV SOLN
INTRAVENOUS | Status: DC
  Administered 2016-11-22: 09:00:00 via INTRAVENOUS

## 2016-11-22 MED ORDER — FUROSEMIDE 10 MG/ML IJ SOLN: 40.0000 mg | Freq: Once | INTRAMUSCULAR | Status: DC

## 2016-11-22 MED ORDER — RESOURCE THICKENUP CLEAR PO POWD
ORAL | Status: DC | PRN
  Filled 2016-11-22: qty 125

## 2016-11-22 MED ORDER — FUROSEMIDE 10 MG/ML IJ SOLN
INTRAMUSCULAR | Status: AC
  Administered 2016-11-22: 20 mg
  Filled 2016-11-22: qty 2

## 2016-11-22 MED ORDER — FUROSEMIDE 10 MG/ML IJ SOLN
INTRAMUSCULAR | Status: AC
  Administered 2016-11-22: 18:00:00
  Filled 2016-11-22: qty 4

## 2016-11-22 MED ORDER — ENOXAPARIN SODIUM 40 MG/0.4ML ~~LOC~~ SOLN: 40.0000 mg | SUBCUTANEOUS | Status: DC

## 2016-11-22 MED ORDER — FUROSEMIDE 10 MG/ML IJ SOLN: 60.0000 mg | Freq: Once | INTRAMUSCULAR | Status: AC

## 2016-11-22 MED ORDER — ENOXAPARIN SODIUM 40 MG/0.4ML ~~LOC~~ SOLN
40.0000 mg | SUBCUTANEOUS | Status: DC
  Administered 2016-11-22 – 2016-11-24 (×3): 40 mg via SUBCUTANEOUS
  Filled 2016-11-22 (×3): qty 0.4

## 2016-11-22 NOTE — Evaluation (Signed)
Clinical/Bedside Swallow Evaluation Patient Details  Name: Luke Montgomery MRN: [id number] Date of Birth: 1925/04/16  Today's Date: 11/22/2016 Time: SLP Start Time (ACUTE ONLY): 0932 SLP Stop Time (ACUTE ONLY): 0949 SLP Time Calculation (min) (ACUTE ONLY): 17 min  Past Medical History:  Past Medical History:  Diagnosis Date  . Dementia   . DVT (deep venous thrombosis) ([place])   . Pulmonary embolism [facility])    Past Surgical History:  Past Surgical History:  Procedure Laterality Date  . HERNIA REPAIR    . ROTATOR CUFF REPAIR    . SMALL INTESTINE SURGERY     secondary to removal of recurrent polyp  . TRANSURETHRAL RESECTION OF PROSTATE     HPI:  Luke Ruan Hyattis a 83 y.o.malewith medical history significant foradvanced dementia, from a memory care unit, recent pna per dtr, DVT and PE brought to the ED with acute altered mental status. CT of the head was negative for acute findings or bleeding. Found to be hypotensivewith troponin of 1.28, shock, acute hypoxic respiratory failure, possible sepsis. CXR CHF with slight interval improvement in interstitial edema. Bibasilar atelectasis or pneumonia with pleural effusions.   Assessment / Plan / Recommendation Clinical Impression  Pt seen for bedside swallow today on his birthday. Midly decreased oral control with thin with suspected premature spill (?) although no s/s aspiration however at increased risk due to respiratory compromise. Pt doesn't not have dentures, is on a chopped meat diet and daughter cuts it up more if needed. Recommend Dys 2 texture, nectar thick liquids with MBS (tomorrow), crush meds, no straws, full supervision/assist.     SLP Visit Diagnosis: Dysphagia, unspecified (R13.10)    Aspiration Risk  Moderate aspiration risk    Diet Recommendation Dysphagia 2 (Fine chop);Nectar-thick liquid   Liquid Administration via: Cup;No straw Medication Administration: Crushed with puree Supervision: Patient able to self feed;Full  supervision/cueing for compensatory strategies Compensations: Slow rate;Small sips/bites;Minimize environmental distractions;Lingual sweep for clearance of pocketing Postural Changes: Seated upright at 90 degrees    Other  Recommendations Oral Care Recommendations: Oral care BID Other Recommendations: Order thickener from pharmacy   Follow up Recommendations Other (comment) (TBD)      Frequency and Duration min 2x/week  2 weeks       Prognosis Prognosis for Safe Diet Advancement: Fair      Swallow Study   General HPI: Luke Boeve Hyattis a 83 y.o.malewith medical history significant foradvanced dementia, from a memory care unit, recent pna per dtr, DVT and PE brought to the ED with acute altered mental status. CT of the head was negative for acute findings or bleeding. Found to be hypotensivewith troponin of 1.28, shock, acute hypoxic respiratory failure, possible sepsis. CXR CHF with slight interval improvement in interstitial edema. Bibasilar atelectasis or pneumonia with pleural effusions. Type of Study: Bedside Swallow Evaluation Previous Swallow Assessment:  (none) Diet Prior to this Study: NPO Temperature Spikes Noted: No Respiratory Status: Nasal cannula History of Recent Intubation: No Behavior/Cognition: Lethargic/Drowsy;Cooperative;Pleasant mood;Confused;Requires cueing Oral Cavity Assessment: Dried secretions (dried blood on hard palate) Oral Care Completed by SLP: Yes Oral Cavity - Dentition: Edentulous (dentures stolen at memory care) Vision: Functional for self-feeding Self-Feeding Abilities: Needs assist;Needs set up Patient Positioning: Upright in bed Baseline Vocal Quality: Low vocal intensity Volitional Cough: Cognitively unable to elicit Volitional Swallow: Unable to elicit    Oral/Motor/Sensory Function Overall Oral Motor/Sensory Function: Within functional limits   Ice Chips Ice chips: Not tested   Thin Liquid Thin Liquid: Impaired Presentation:  Cup Pharyngeal  Phase Impairments: Suspected delayed Swallow    Nectar Thick Nectar Thick Liquid: Not tested   Honey Thick Honey Thick Liquid: Not tested   Puree Puree: Within functional limits   Solid   GO   Solid: Not tested        Luke Montgomery 11/22/2016,10:08 AM  Luke Montgomery.Ed [employer] [phone]

## 2016-11-22 NOTE — Progress Notes (Signed)
Patient began to wheeze. MD aware and Elink called. IV lasix given patient placed back on non-rebreather and fluids stopped. Shannin Naab Mikel Cella, BSN, MSN, RN3. 11/22/2016 @ 17:30

## 2016-11-22 NOTE — Progress Notes (Signed)
NEUROLOGY PROGRESS NOTE  S: Seen and examined. No new complaints. Breathing more comfortably on the ventimask.  O: Vitals:   11/22/16 0400 11/22/16 0737  BP: 136/79   Pulse: (!) 101   Resp: (!) 21   Temp: 98.1 F (36.7 C) 98.2 F (36.8 C)  SpO2: 99%    Gen: elderly man in NAD breathing via mask HEENT: NCAT CVS: S1S2+, rrr Chest: CTABL Ext: warm, well perfused  Neurological Alert, awake, oriented to self only. Poor attention and concentration. Speech is clear although he is edentulous and has somewhat thick speech but is not dysarthric. CN: PERRL, EOMI, VFF, face grossly symmetric. Motor: antigravity on both UE, moves both LE but does not follow enough to sustain the legs against graviity. Has a coarse tremor in both UE. Sensory: intact to LT Gait not tested  Labs CBC    Component Value Date/Time   WBC 8.0 11/22/2016 0528   RBC 3.98 (L) 11/22/2016 0528   HGB 12.0 (L) 11/22/2016 0528   HCT 38.5 (L) 11/22/2016 0528   PLT 81 (L) 11/22/2016 0528   MCV 96.7 11/22/2016 0528   MCH 30.2 11/22/2016 0528   MCHC 31.2 11/22/2016 0528   RDW 15.0 11/22/2016 0528   LYMPHSABS 1.9 11/24/2016 1300   MONOABS 0.9 11/01/2016 1300   EOSABS 0.0 11/02/2016 1300   BASOSABS 0.0 11/30/2016 1300   CMP     Component Value Date/Time   NA 152 (H) 11/22/2016 0528   K 3.6 11/22/2016 0528   CL 121 (H) 11/22/2016 0528   CO2 24 11/22/2016 0528   GLUCOSE 106 (H) 11/22/2016 0528   BUN 32 (H) 11/22/2016 0528   CREATININE 1.23 11/22/2016 0528   CALCIUM 7.9 (L) 11/22/2016 0528   PROT 4.9 (L) 11/22/2016 0528   ALBUMIN 2.7 (L) 11/22/2016 0528   AST 253 (H) 11/22/2016 0528   ALT 207 (H) 11/22/2016 0528   ALKPHOS 82 11/22/2016 0528   BILITOT 0.7 11/22/2016 0528   GFRNONAA 50 (L) 11/22/2016 0528   GFRAA 58 (L) 11/22/2016 0528   CXR: RLL pna  CTH: no acute process   Current Facility-Administered Medications:  .  acetaminophen (TYLENOL) tablet 650 mg, 650 mg, Oral, Q6H PRN **OR**  acetaminophen (TYLENOL) suppository 650 mg, 650 mg, Rectal, Q6H PRN, Shawn Route, Sara E, PA-C .  amantadine (SYMMETREL) capsule 100 mg, 100 mg, Oral, BID, Rondel Jumbo, PA-C, 100 mg at 11/22/16 0836 .  aztreonam (AZACTAM) 1 g in dextrose 5 % 50 mL IVPB, 1 g, Intravenous, Q8H, Rumbarger, Valeda Malm, RPH, Stopped at 11/22/16 3474 .  bisacodyl (DULCOLAX) suppository 10 mg, 10 mg, Rectal, Daily PRN, Rondel Jumbo, PA-C .  busPIRone (BUSPAR) tablet 10 mg, 10 mg, Oral, BID WC, Wertman, Coralee Pesa, PA-C, 10 mg at 11/22/16 2595 .  carbamazepine (TEGRETOL) tablet 200 mg, 200 mg, Oral, BID WC, Wertman, Coralee Pesa, PA-C, 200 mg at 11/22/16 0836 .  citalopram (CELEXA) tablet 20 mg, 20 mg, Oral, Daily, Wertman, Sara E, PA-C .  dextrose 5 %-0.45 % sodium chloride infusion, , Intravenous, Continuous, Florencia Reasons, MD, Last Rate: 50 mL/hr at 11/22/16 0837 .  donepezil (ARICEPT) tablet 10 mg, 10 mg, Oral, QHS, Rondel Jumbo, PA-C, 10 mg at 11/14/2016 2342 .  [START ON 11/23/2016] levofloxacin (LEVAQUIN) IVPB 500 mg, 500 mg, Intravenous, Q48H, Rumbarger, Valeda Malm, RPH .  LORazepam (ATIVAN) tablet 0.5 mg, 0.5 mg, Oral, TID, Rondel Jumbo, PA-C, 0.5 mg at 11/22/16 0835 .  magnesium hydroxide (MILK OF MAGNESIA)  suspension 30 mL, 30 mL, Oral, Daily PRN, Rondel Jumbo, PA-C .  MEDLINE mouth rinse, 15 mL, Mouth Rinse, BID, Waldemar Dickens, MD, 15 mL at 11/19/2016 2228 .  memantine (NAMENDA XR) 24 hr capsule 14 mg, 14 mg, Oral, Daily, Rondel Jumbo, PA-C, 14 mg at 11/22/16 1552 .  ondansetron (ZOFRAN) tablet 4 mg, 4 mg, Oral, Q6H PRN **OR** ondansetron (ZOFRAN) injection 4 mg, 4 mg, Intravenous, Q6H PRN, Shawn Route, Sara E, PA-C .  risperiDONE (RISPERDAL) tablet 0.5 mg, 0.5 mg, Oral, Q4H PRN, Rondel Jumbo, PA-C, 0.5 mg at 11/02/2016 2223 .  senna-docusate (Senokot-S) tablet 1 tablet, 1 tablet, Oral, QHS PRN, Shawn Route, Sara E, PA-C .  tamsulosin (FLOMAX) capsule 0.4 mg, 0.4 mg, Oral, QHS, Wertman, Sara E, PA-C .  traZODone (DESYREL)  tablet 50 mg, 50 mg, Oral, QHS, Wertman, Sara E, PA-C .  vancomycin (VANCOCIN) IVPB 750 mg/150 ml premix, 750 mg, Intravenous, Q24H, Rumbarger, Valeda Malm, RPH  Assessment 89/M with PMH of Dementia, DVT and PE (unclear if on DOAC), brought in as a code stroke, was altered with non focal exam. Found to be septic and in respiratory distress from a pneumonia. Exam improved today. Unclear of baseline but my feeling is that this is his baseline. Likely presentation AMS secondary to sepsis and resp distress. Also contributing may be multiple sedating medications.  Impression Toxic metabolic encephalopathy - multifactorial Less likely stroke Polypharmacy  Recs; Improving exam - can discontinue MRI. Can obtain MRI if exam worsens Correction of toxic metabolic derangements as you are. He is on 2 antipsychotics along with antidepressants increasing risk for drug induced parkinsons as well as serotonin syndrome. His medication list must be re evaluated to minimize the sedating medications and optimizing the antipsychotics and antidepressants.  We will be available as needed. Please call with questions.  -- Amie Portland, MD [facility] [phone]  If 7pm to 7am, please call on call as listed on AMION.

## 2016-11-22 NOTE — Progress Notes (Signed)
PROGRESS NOTE  [facility] WUJ:[id number] DOB: [birth year] DOA: 11/16/2016 PCP: Deland Pretty, MD  HPI/Recap of past 24 hours:  Only oriented to self,  On ventimask , he is talking to daughter at bedside  Assessment/Plan: Active Problems:   Dementia with behavioral disturbance   Syncope   HCAP (healthcare-associated pneumonia)   Hx of pulmonary embolus   BPH (benign prostatic hyperplasia)   Confusion   Sepsis (HCC)   Hypotension   Thrombocytopenia (HCC)   Sepsis /acute hypoxic respiratory failure/metabolic encephalopathy presented on admission Ct head no acute findings, neurology consulted, input appreciated He received vanc/azetronam/levaquin since admission, cxr with pulmonary edema, bilateral lower lung fields atelectasis vs pneumonia? Patient does has risk of aspiration pneumonia, will keep levaquin, d/c vanc/azetrenam Continue oxygen supplement, wean as tolerated. He also have signs of fluids overload, echo pending, d/c ivf, prn lasix for now  Elevated lft: from spesis, from chf?  Treat sepsis with abx, on prn lasix, repeat cmp in am  Thrombocytopenia: acute, from infection? Repeat in am, no sign of bleeding  AKI on CKD III: cr 1.8 on admission, cr 1.23 today, ua no infection, repeat bmp in am, renal dosing meds  H/o DVT/PE, he was taken off anticoagulation recently due to frequent falls  Dementia/FTT: per daughter , patient as baseline only oriented to self.  Daughter would consider palliative approach if patient does not improve or get worse.  Diet per speech recommendation:  Dysphagia 2 (Fine chop);Nectar-thick liquid   Liquid Administration via: Cup;No straw Medication Administration: Crushed with puree Supervision: Patient able to self feed;Full supervision/cueing for compensatory strategies Compensations: Slow rate;Small sips/bites;Minimize environmental distractions;Lingual sweep for clearance of pocketing Postural Changes: Seated upright at 90 degrees     Code Status: DNR  Family Communication: patient and daughter at bedside  Disposition Plan: remain in stepdown   Consultants:  Neurology  Critical care  Procedures:  none  Antibiotics:  Vanc/aztreonam from admission to 8/22  levaquin from admission    Objective: BP 136/79 (BP Location: Right Arm)   Pulse (!) 101   Temp 98.1 F (36.7 C) (Oral)   Resp (!) 21   Ht 5\' 8"  (1.727 m)   Wt 69.6 kg (153 lb 7 oz)   SpO2 99%   BMI 23.33 kg/m   Intake/Output Summary (Last 24 hours) at 11/22/16 0732 Last data filed at 11/07/2016 1458  Gross per 24 hour  Intake             1050 ml  Output                0 ml  Net             1050 ml   Filed Weights   11/29/2016 1325 11/05/2016 1615  Weight: 72.2 kg (159 lb 2.8 oz) 69.6 kg (153 lb 7 oz)    Exam: Patient is examined daily including today on 11/22/2016, exams remain the same as of yesterday except that has changed    General:  Frail, demented elderly, on oxygen supplement, does not seem in distress  Cardiovascular: RRR  Respiratory: crackles at basis  Abdomen: Soft/ND/NT, positive BS  Musculoskeletal: No Edema  Neuro: alert, oriented to self only  Data Reviewed: Basic Metabolic Panel:  Recent Labs Lab 11/29/2016 1300 11/22/16 0528  NA 149* 152*  K 4.5 3.6  CL 114* 121*  CO2 18* 24  GLUCOSE 154* 106*  BUN 35* 32*  CREATININE 1.80* 1.23  CALCIUM 9.1 7.9*   Liver  Function Tests:  Recent Labs Lab 11/14/2016 1300 11/22/16 0528  AST 115* 253*  ALT 83* 207*  ALKPHOS 103 82  BILITOT 1.4* 0.7  PROT 6.2* 4.9*  ALBUMIN 3.4* 2.7*   No results for input(s): LIPASE, AMYLASE in the last 168 hours. No results for input(s): AMMONIA in the last 168 hours. CBC:  Recent Labs Lab 11/24/2016 1300 11/16/2016 1626 11/22/16 0528  WBC 10.6*  --  8.0  NEUTROABS 7.8*  --   --   HGB 13.9  --  12.0*  HCT 43.9  --  38.5*  MCV 95.4  --  96.7  PLT 87* 71* 81*   Cardiac Enzymes:   No results for input(s): CKTOTAL,  CKMB, CKMBINDEX, TROPONINI in the last 168 hours. BNP (last 3 results) No results for input(s): BNP in the last 8760 hours.  ProBNP (last 3 results) No results for input(s): PROBNP in the last 8760 hours.  CBG: No results for input(s): GLUCAP in the last 168 hours.  No results found for this or any previous visit (from the past 240 hour(s)).   Studies: X-ray Chest Pa And Lateral  Result Date: 11/22/2016 CLINICAL DATA:  Sepsis, healthcare associated pneumonia, CHF, history of pulmonary embolism. EXAM: CHEST  2 VIEW COMPARISON:  Portable chest x-ray of November 21, 2016 FINDINGS: The lungs are reasonably well inflated. The interstitial markings remain increased but have improved slightly. There is persistent confluent density just above the right hemidiaphragm. The cardiac silhouette is enlarged. The central pulmonary vascularity is prominent. There is calcification in the wall of the aortic arch. There is a small right pleural effusion and trace left pleural effusion. There are degenerative changes of both shoulders. IMPRESSION: CHF with slight interval improvement in interstitial edema. Bibasilar atelectasis or pneumonia with pleural effusions. Thoracic aortic atherosclerosis. Electronically Signed   By: David  Martinique M.D.   On: 11/22/2016 07:26   Dg Chest Portable 1 View  Result Date: 11/04/2016 CLINICAL DATA:  Hypoxia, altered mental status, unresponsive EXAM: PORTABLE CHEST 1 VIEW COMPARISON:  08/29/2016 FINDINGS: There is bilateral diffuse interstitial thickening. There is right lower lobe airspace disease concerning for pneumonia. There is no pleural effusion or pneumothorax. There is stable cardiomegaly. There is no acute osseous abnormality. There is loss of the normal acromion humeral distance bilaterally as can be seen with chronic rotator cuff tears. IMPRESSION: Mild CHF. Right lower lobe airspace disease concerning for pneumonia. Followup PA and lateral chest X-ray is recommended in 3-4  weeks following trial of antibiotic therapy to ensure resolution and exclude underlying malignancy. Electronically Signed   By: Kathreen Devoid   On: 11/24/2016 13:55   Ct Head Code Stroke Wo Contrast  Result Date: 11/13/2016 CLINICAL DATA:  Code stroke. Patient unresponsive. Last seen normal at 11 a.m. EXAM: CT HEAD WITHOUT CONTRAST TECHNIQUE: Contiguous axial images were obtained from the base of the skull through the vertex without intravenous contrast. COMPARISON:  CT head, most recent, 10/11/2016. FINDINGS: Brain: No evidence for acute infarction, hemorrhage, mass lesion, hydrocephalus, or extra-axial fluid. Extensive atrophy. BILATERAL hypoattenuation of white matter, likely small vessel disease. Chronic appearing subcortical infarct RIGHT posterior frontal lobe, unchanged from priors. Vascular: Calcification of the cavernous internal carotid arteries consistent with cerebrovascular atherosclerotic disease. No signs of intracranial large vessel occlusion. Skull: Normal. Negative for fracture or focal lesion. Sinuses/Orbits: No acute finding. Other: None. ASPECTS [facility] Stroke Program Early CT Score) - Ganglionic level infarction (caudate, lentiform nuclei, internal capsule, insula, M1-M3 cortex): 7 - Supraganglionic infarction (M4-M6  cortex): 3 Total score (0-10 with 10 being normal): 10 IMPRESSION: 1. Atrophy and small vessel disease. No areas of acute infarction are identified. 2. ASPECTS is 10. These results were called by telephone at the time of interpretation on 11/09/2016 at 1:30 pm to Dr. Rory Percy , who verbally acknowledged these results. Electronically Signed   By: Staci Righter M.D.   On: 11/16/2016 13:38    Scheduled Meds: . amantadine  100 mg Oral BID  . busPIRone  10 mg Oral BID WC  . carbamazepine  200 mg Oral BID WC  . citalopram  20 mg Oral Daily  . donepezil  10 mg Oral QHS  . LORazepam  0.5 mg Oral TID  . mouth rinse  15 mL Mouth Rinse BID  . memantine  14 mg Oral Daily  .  tamsulosin  0.4 mg Oral QHS  . traZODone  50 mg Oral QHS    Continuous Infusions: . aztreonam 1 g (11/22/16 9379)  . dextrose 5 % and 0.45% NaCl    . [START ON 11/23/2016] levofloxacin (LEVAQUIN) IV    . vancomycin       Time spent: 74mins I have personally reviewed and interpreted on  11/22/2016 daily labs, tele strips, imagings as discussed above under date review session and assessment and plans.  I have discussed plan of care as described above with RN , patient and family on 11/22/2016   Donnis Pecha MD, PhD  [facility] Pager [phone]. If 7PM-7AM, please contact night-coverage at [email or web address], password [facility] 11/22/2016, 7:32 AM  LOS: 1 day

## 2016-11-22 NOTE — Plan of Care (Signed)
Problem: Safety: Goal: Ability to remain free from injury will improve Outcome: Progressing Patient remained safe and in bed through this shift. High fall socks in place, and bed alarm utilized.   Problem: Health Behavior/Discharge Planning: Goal: Ability to manage health-related needs will improve Outcome: Progressing Patient unable to tolerate low reclined positions for prolonged times, oxygen saturations maintained with NRB

## 2016-11-22 NOTE — Progress Notes (Signed)
  Echocardiogram 2D Echocardiogram has been performed.  [name] 11/22/2016, 5:59 PM

## 2016-11-22 NOTE — Progress Notes (Signed)
[place] Progress Note Patient Name: Luke [facility] DOB: 10/31/1925 MRN: [id number]   Date of Service  11/22/2016  HPI/Events of Note  Patient with severe resp distress Patient is DNR/DNI    eICU Interventions  Will give lasix and morphine as needed      Intervention Category Evaluation Type: Other  Flora Lipps 11/22/2016, 5:23 PM

## 2016-11-22 NOTE — Progress Notes (Signed)
Patient to radiology.

## 2016-11-23 ENCOUNTER — Inpatient Hospital Stay (HOSPITAL_COMMUNITY): Payer: Medicare PPO

## 2016-11-23 DIAGNOSIS — R4182 Altered mental status, unspecified: Secondary | ICD-10-CM

## 2016-11-23 DIAGNOSIS — Z86711 Personal history of pulmonary embolism: Secondary | ICD-10-CM

## 2016-11-23 DIAGNOSIS — E43 Unspecified severe protein-calorie malnutrition: Secondary | ICD-10-CM

## 2016-11-23 LAB — COMPREHENSIVE METABOLIC PANEL
ALBUMIN: 2.7 g/dL — AB (ref 3.5–5.0)
ALT: 170 U/L — ABNORMAL HIGH (ref 17–63)
ANION GAP: 7 (ref 5–15)
AST: 91 U/L — ABNORMAL HIGH (ref 15–41)
Alkaline Phosphatase: 87 U/L (ref 38–126)
BILIRUBIN TOTAL: 0.8 mg/dL (ref 0.3–1.2)
BUN: 30 mg/dL — ABNORMAL HIGH (ref 6–20)
CO2: 25 mmol/L (ref 22–32)
Calcium: 8.2 mg/dL — ABNORMAL LOW (ref 8.9–10.3)
Chloride: 119 mmol/L — ABNORMAL HIGH (ref 101–111)
Creatinine, Ser: 1.67 mg/dL — ABNORMAL HIGH (ref 0.61–1.24)
GFR calc Af Amer: 40 mL/min — ABNORMAL LOW (ref >60)
GFR, EST NON AFRICAN AMERICAN: 35 mL/min — AB (ref >60)
Glucose, Bld: 111 mg/dL — ABNORMAL HIGH (ref 65–99)
POTASSIUM: 3.5 mmol/L (ref 3.5–5.1)
Sodium: 151 mmol/L — ABNORMAL HIGH (ref 135–145)
TOTAL PROTEIN: 5.3 g/dL — AB (ref 6.5–8.1)

## 2016-11-23 LAB — ECHOCARDIOGRAM COMPLETE
Ao-asc: 32 cm
FS: 23 % — AB (ref 28–44)
Height: 68 in
IV/PV OW: 1.11
LA diam end sys: 33 mm
LA vol A4C: 37.4 ml
LADIAMINDEX: 1.8 cm/m2
LASIZE: 33 mm
LVOT area: 3.46 cm2
LVOT diameter: 21 mm
PW: 9 mm — AB (ref 0.6–1.1)
RV sys press: 59 mmHg
Reg peak vel: 333 cm/s
TRMAXVEL: 333 cm/s
Weight: 2455.04 oz

## 2016-11-23 LAB — CBC
HEMATOCRIT: 39.1 % (ref 39.0–52.0)
Hemoglobin: 12.1 g/dL — ABNORMAL LOW (ref 13.0–17.0)
MCH: 29.4 pg (ref 26.0–34.0)
MCHC: 30.9 g/dL (ref 30.0–36.0)
MCV: 94.9 fL (ref 78.0–100.0)
Platelets: 92 10*3/uL — ABNORMAL LOW (ref 150–400)
RBC: 4.12 MIL/uL — AB (ref 4.22–5.81)
RDW: 15 % (ref 11.5–15.5)
WBC: 8.8 10*3/uL (ref 4.0–10.5)

## 2016-11-23 LAB — GLUCOSE, CAPILLARY: GLUCOSE-CAPILLARY: 118 mg/dL — AB (ref 65–99)

## 2016-11-23 LAB — PROCALCITONIN: PROCALCITONIN: 1.43 ng/mL

## 2016-11-23 LAB — LACTIC ACID, PLASMA: LACTIC ACID, VENOUS: 1.4 mmol/L (ref 0.5–1.9)

## 2016-11-23 LAB — AMMONIA: AMMONIA: 30 umol/L (ref 9–35)

## 2016-11-23 MED ORDER — DEXTROSE 5 % IV SOLN
1.0000 g | Freq: Three times a day (TID) | INTRAVENOUS | Status: DC
  Administered 2016-11-23 – 2016-11-24 (×3): 1 g via INTRAVENOUS
  Filled 2016-11-23 (×4): qty 1

## 2016-11-23 MED ORDER — ADULT MULTIVITAMIN W/MINERALS CH
1.0000 | ORAL_TABLET | Freq: Every day | ORAL | Status: DC
  Administered 2016-11-24: 1 via ORAL
  Filled 2016-11-23: qty 1

## 2016-11-23 NOTE — Progress Notes (Addendum)
  Speech Language Pathology  Patient Details Name: MAXTEN SHULER MRN: [id number] DOB: 03-26-1926 Today's Date: 11/23/2016 Time:  -     Paged from RN stating pt is lethargic, still on non-rebreather this morning and unable to go down for MBS. Will check in with RN around noon for rescheduling this afternoon if pt improved versus deferring to tomorrow. Recommend holding all po's until MBS can be completed in light of worsening pna per CXR this morning.            [name] 11/23/2016, 9:21 AM   [name].Ed [employer] [phone]

## 2016-11-23 NOTE — Progress Notes (Signed)
Initial Nutrition Assessment  DOCUMENTATION CODES:   Severe malnutrition in context of chronic illness  INTERVENTION:   -Magic Cup TID with meals -MVI daily  NUTRITION DIAGNOSIS:   Malnutrition (Severe) related to chronic illness (dementia) as evidenced by severe depletion of body fat, severe depletion of muscle mass.  GOAL:   Patient will meet greater than or equal to 90% of their needs  MONITOR:   PO intake, Supplement acceptance, Diet advancement, Labs, Weight trends, Skin, I & O's  REASON FOR ASSESSMENT:   Low Braden    ASSESSMENT:   Luke [facility] is a 83 y.o. male who presents with sepsis and acute respiratory failure from HCAP. Pt is DNR and no aggressive measures should be undertaken per family request. IVF, O2, and ABX are OK at this time. Pt responding well to IVF and ABX started in ED.   8/22- s/ps BSE, advanced to dysphagia 2 diet with nectar thick liquids  Pt resided at [facility]. Pt unable to provide hx and no family present at time of visit.   Per SLP, pt too lethargic for MBSS this AM; plan to reschedule for later this afternoon vs tomorrow 11/24/16. Case discussed with RN, who reports main barrier to PO intake is mental status. When pt is awake and alert, he has a very good appetite. Nurse tech feeding pt at time of visit; consuming grits and thickened juice without difficulty.   Wt hx reviewed, which reveals wt stability over the past 9 months.  Nutrition-Focused physical exam completed. Findings are severe fat depletion, severe muscle depletion, and no edema.   Labs reviewed: Na: 151, CBGS: 118.   Diet Order:  DIET DYS 2 Room service appropriate? Yes; Fluid consistency: Nectar Thick  Skin:  Reviewed, no issues  Last BM:  11/22/16  Height:   Ht Readings from Last 1 Encounters:  11/10/2016 5\' 8"  (1.727 m)    Weight:   Wt Readings from Last 1 Encounters:  11/04/2016 153 lb 7 oz (69.6 kg)    Ideal Body Weight:  70 kg  BMI:  Body  mass index is 23.33 kg/m.  Estimated Nutritional Needs:   Kcal:  1550-1750  Protein:  70-85 grams  Fluid:  1.5-1.7 L  EDUCATION NEEDS:   No education needs identified at this time  Charisse Wendell A. Jimmye Norman, RD, LDN, CDE Pager: [phone] After hours Pager: [phone]

## 2016-11-23 NOTE — Clinical Social Work Note (Signed)
Clinical Social Work Assessment  Patient Details  Name: Luke [facility] MRN: [id number] Date of Birth: [birth year]  Date of referral:  11/23/16               Reason for consult:  Facility Placement, Discharge Planning                Permission sought to share Montgomery with:  Facility Sport and exercise psychologist, Family Supports Permission granted to share Montgomery::  Yes, Verbal Permission Granted  Name::     Luke [facility]::  [facility]  Relationship::  Daughter  Contact Montgomery:  [phone]  Housing/Transportation Living arrangements for the past 2 months:  [facility]:  Medical Team, Adult Children Patient Interpreter Needed:  None Criminal Activity/Legal Involvement Pertinent to Current Situation/Hospitalization:  No - Comment as needed Significant Relationships:  Adult Children, Other Family Members Lives with:  Facility Resident Do you feel safe going back to the place where you live?  Yes Need for family participation in patient care:  Yes (Comment)  Care giving concerns:  Patient is a resident at [facility].    Social Worker assessment / plan:  Patient oriented to self only. No supports at bedside. CSW called patient's daughter. CSW introduced role and explained that discharge planning would be discussed. Patient's daughter confirmed that he was admitted from [facility] but is interested in him going to SNF. Patient's daughter reports many falls within the past year and is concerned that ALF is unable to provide the level of care that the patient needs right now. Patient's daughter states that [facility] was working on getting him into SNF before admission. Patient's daughter stated that she toured [facility] recently and liked the facility. [facility] is second preference. These two facilities are close to his daughter's home. PT was unable to evaluate patient today. CSW paged MD  regarding putting in OT order. She stated she is going to see how he does for the next 24 hours. May need palliative consult. SNF list left in patient's room for daughter to review. No further concerns. CSW encouraged patient's daughter to contact CSW as needed. CSW will continue to follow patient for support and facilitate discharge to SNF vs. ALF once medically stable.  Employment status:  Retired Nurse, adult PT Recommendations:  Not assessed at this time Montgomery / Referral to community resources:  [facility]  Patient/Family's Response to care:  Patient oriented to self only. Patient's daughter agreeable to SNF placement if recommended. Patient's family supportive and involved in patient's care. Patient's daughter appreciated social work intervention.  Patient/Family's Understanding of and Emotional Response to Diagnosis, Current Treatment, and Prognosis:  Patient oriented to self only. Patient's daughter has a good understanding of the reason for admission and his possible need for rehab at this time. Patient's daughter gave details of physical decline over the past year. Patient's daughter appears happy with hospital care.  Emotional Assessment Appearance:  Appears stated age Attitude/Demeanor/Rapport:  Unable to Assess Affect (typically observed):  Unable to Assess Orientation:  Oriented to Self Alcohol / Substance use:  Never Used Psych involvement (Current and /or in the community):  No (Comment)  Discharge Needs  Concerns to be addressed:  Care Coordination Readmission within the last 30 days:  No Current discharge risk:  Cognitively Impaired, Dependent with Mobility Barriers to Discharge:  Continued Medical Work up, [place], LCSW 11/23/2016,  2:51 PM

## 2016-11-23 NOTE — Progress Notes (Signed)
Pharmacy Antibiotic Note  Luke [facility] is a 83 y.o. male de-escalated from vancomycin, aztreonam, and levaquin yesterday to just levaquin now with fever to 101.2. Pharmacy asked to resume aztreonam. Cr trending up 1.23 > 1.67 with CrCl ~ 29 ml/min.  Plan: 1) Aztreonam 1g IV q8  Height: 5\' 8"  (172.7 cm) Weight: 153 lb 7 oz (69.6 kg) IBW/kg (Calculated) : 68.4  Temp (24hrs), Avg:99.5 F (37.5 C), Min:97.8 F (36.6 C), Max:101.2 F (38.4 C)   Recent Labs Lab 11/17/2016 1300 11/24/2016 1344 11/28/2016 1626 11/28/2016 1638 11/12/2016 1821 11/22/16 0528 11/23/16 0549  WBC 10.6*  --   --   --   --  8.0 8.8  CREATININE 1.80*  --   --   --   --  1.23 1.67*  LATICACIDVEN  --  7.07* 2.0* 1.98* 2.5*  --  1.4    Estimated Creatinine Clearance: 29 mL/min (A) (by C-G formula based on SCr of 1.67 mg/dL (H)).    Allergies  Allergen Reactions  . Dilaudid [Hydromorphone Hcl] Other (See Comments)    Out of his mind. Extremely confused.   Marland Kitchen Penicillins Other (See Comments)    Has patient had a PCN reaction causing immediate rash, facial/tongue/throat swelling, SOB or lightheadedness with hypotension: Yes Has patient had a PCN reaction causing severe rash involving mucus membranes or skin necrosis: Unknown Has patient had a PCN reaction that required hospitalization Unknown Has patient had a PCN reaction occurring within the last 10 years: Unknown above answers are "NO", then may proceed with Cephalosporin use.     Antimicrobials this admission: Vancomycin 8/21>>8/22 Aztreo 8/21>>8/22, resume 8/23 >> Levaquin 8/21>>  Dose adjustments this admission: n/a  Microbiology results: 8/21: UC: negative 8/21: BC x 2>> ngtd  Thank you for allowing pharmacy to be a part of this patient's care.  Deboraha Sprang 11/23/2016 4:15 PM

## 2016-11-23 NOTE — Progress Notes (Addendum)
PROGRESS NOTE  Luke Montgomery KGY:[id number] DOB: [birth year] DOA: 11/03/2016 PCP: Deland Pretty, MD  HPI/Recap of past 24 hours:  On NRB, he is not awake  Assessment/Plan: Active Problems:   Dementia with behavioral disturbance   Syncope   HCAP (healthcare-associated pneumonia)   Hx of pulmonary embolus   BPH (benign prostatic hyperplasia)   Confusion   Sepsis (HCC)   Hypotension   Thrombocytopenia (HCC)   Sepsis /acute hypoxic respiratory failure/metabolic encephalopathy presented on admission Ct head no acute findings, neurology consulted, input appreciated He received vanc/azetronam/levaquin since admission, cxr with pulmonary edema, bilateral lower lung fields atelectasis vs pneumonia? Patient does has risk of aspiration pneumonia, will keep levaquin,  Vanc/azetrenam d/ced on 8/22 He also has signs of fluids overload, echo lvef55% with grade 1 diastolic dysfunction, d/c ivf, prn lasix for now  Persistent hypoxia, spike fever on 8/23, repeat blood culture, ct chest consistent with right upper and lower lobe pneumonia Likely aspiration pneumonia, restart aztreonam due to spike fever on levaquin.  Elevated lft: from spesis, from chf?  Treat sepsis with abx, on prn lasix, cmp improving,  ammonia level wnl  Thrombocytopenia: acute, from infection? Repeat in am, no sign of bleeding, does has some petechiae on bilateral lower extremities  AKI on CKD III: cr 1.8 on admission, then cr 1.23 on 8/22, then 1.67 on 8/23  ua no infection,  Cr worsening, repeat bmp in am, renal dosing meds  H/o DVT/PE, he was taken off anticoagulation recently due to frequent falls  Dementia/FTT: per daughter , patient as baseline only oriented to self.  Daughter would consider palliative approach if patient does not improve or get worse.  Sever malnutrition: with muscle wasting and fat depletion on exam,   Diet per speech recommendation:  Dysphagia 2 (Fine chop);Nectar-thick liquid   Liquid  Administration via: Cup;No straw Medication Administration: Crushed with puree Supervision: Patient able to self feed;Full supervision/cueing for compensatory strategies Compensations: Slow rate;Small sips/bites;Minimize environmental distractions;Lingual sweep for clearance of pocketing Postural Changes: Seated upright at 90 degrees   Code Status: DNR  Family Communication: patient and daughter at bedside  Disposition Plan: remain in stepdown, not improving, consider palliative care on 8/24   Consultants:  Neurology  Critical care  Procedures:  none  Antibiotics:  Vanc/aztreonam from admission to 8/22  levaquin from admission    Objective: BP 139/71 (BP Location: Right Arm)   Pulse (!) 115   Temp 99.6 F (37.6 C) (Axillary)   Resp (!) 22   Ht 5\' 8"  (1.727 m)   Wt 69.6 kg (153 lb 7 oz)   SpO2 99%   BMI 23.33 kg/m   Intake/Output Summary (Last 24 hours) at 11/23/16 0739 Last data filed at 11/23/16 0029  Gross per 24 hour  Intake           613.34 ml  Output             1200 ml  Net          -586.66 ml   Filed Weights   11/20/2016 1325 11/27/2016 1615  Weight: 72.2 kg (159 lb 2.8 oz) 69.6 kg (153 lb 7 oz)    Exam: Patient is examined daily including today on 11/23/2016, exams remain the same as of yesterday except that has changed    General:  Frail, demented elderly, on NRB, not responding  Cardiovascular: RRR  Respiratory: crackles at basis, no wheezing  Abdomen: Soft/ND/NT, positive BS  Musculoskeletal: mild pitting Edema bilateral lower extremities,  petechiae bilateral lower extremities  Neuro: not alert today, does not follow commands  Data Reviewed: Basic Metabolic Panel:  Recent Labs Lab 11/03/2016 1300 11/22/16 0528 11/23/16 0549  NA 149* 152* 151*  K 4.5 3.6 3.5  CL 114* 121* 119*  CO2 18* 24 25  GLUCOSE 154* 106* 111*  BUN 35* 32* 30*  CREATININE 1.80* 1.23 1.67*  CALCIUM 9.1 7.9* 8.2*   Liver Function Tests:  Recent Labs Lab  11/30/2016 1300 11/22/16 0528 11/23/16 0549  AST 115* 253* 91*  ALT 83* 207* 170*  ALKPHOS 103 82 87  BILITOT 1.4* 0.7 0.8  PROT 6.2* 4.9* 5.3*  ALBUMIN 3.4* 2.7* 2.7*   No results for input(s): LIPASE, AMYLASE in the last 168 hours.  Recent Labs Lab 11/23/16 0549  AMMONIA 30   CBC:  Recent Labs Lab 11/27/2016 1300 11/08/2016 1626 11/22/16 0528 11/23/16 0549  WBC 10.6*  --  8.0 8.8  NEUTROABS 7.8*  --   --   --   HGB 13.9  --  12.0* 12.1*  HCT 43.9  --  38.5* 39.1  MCV 95.4  --  96.7 94.9  PLT 87* 71* 81* 92*   Cardiac Enzymes:   No results for input(s): CKTOTAL, CKMB, CKMBINDEX, TROPONINI in the last 168 hours. BNP (last 3 results) No results for input(s): BNP in the last 8760 hours.  ProBNP (last 3 results) No results for input(s): PROBNP in the last 8760 hours.  CBG: No results for input(s): GLUCAP in the last 168 hours.  Recent Results (from the past 240 hour(s))  Urine culture     Status: None   Collection Time: 11/26/2016  1:56 PM  Result Value Ref Range Status   Specimen Description URINE, RANDOM  Final   Special Requests NONE  Final   Culture NO GROWTH  Final   Report Status 11/22/2016 FINAL  Final  Blood Culture (routine x 2)     Status: None (Preliminary result)   Collection Time: 11/18/2016  2:10 PM  Result Value Ref Range Status   Specimen Description BLOOD RIGHT ANTECUBITAL  Final   Special Requests   Final    BOTTLES DRAWN AEROBIC AND ANAEROBIC BACTEROIDES CACCAE   Culture NO GROWTH < 24 HOURS  Final   Report Status PENDING  Incomplete  Blood Culture (routine x 2)     Status: None (Preliminary result)   Collection Time: 11/03/2016  2:15 PM  Result Value Ref Range Status   Specimen Description BLOOD LEFT ANTECUBITAL  Final   Special Requests IN PEDIATRIC BOTTLE Blood Culture adequate volume  Final   Culture NO GROWTH < 24 HOURS  Final   Report Status PENDING  Incomplete  MRSA PCR Screening     Status: None   Collection Time: 11/22/16  7:28 AM    Result Value Ref Range Status   MRSA by PCR NEGATIVE NEGATIVE Final    Comment:        The GeneXpert MRSA Assay (FDA approved for NASAL specimens only), is one component of a comprehensive MRSA colonization surveillance program. It is not intended to diagnose MRSA infection nor to guide or monitor treatment for MRSA infections.      Studies: No results found.  Scheduled Meds: . amantadine  100 mg Oral BID  . busPIRone  10 mg Oral BID WC  . carbamazepine  200 mg Oral BID WC  . citalopram  20 mg Oral Daily  . donepezil  10 mg Oral QHS  . enoxaparin (LOVENOX)  injection  40 mg Subcutaneous Q24H  . LORazepam  0.5 mg Oral TID  . mouth rinse  15 mL Mouth Rinse BID  . memantine  14 mg Oral Daily  . tamsulosin  0.4 mg Oral QHS  . traZODone  50 mg Oral QHS    Continuous Infusions: . levofloxacin (LEVAQUIN) IV       Time spent: 50mins I have personally reviewed and interpreted on  11/23/2016 daily labs, tele strips, imagings as discussed above under date review session and assessment and plans.  I have discussed plan of care as described above with RN , social worker on 11/23/2016   Jarvis Sawa MD, PhD  [facility] Pager [phone]. If 7PM-7AM, please contact night-coverage at [email or web address], password [facility] 11/23/2016, 7:39 AM  LOS: 2 days

## 2016-11-23 NOTE — Progress Notes (Signed)
  Speech Language Pathology  Patient Details Name: Luke Montgomery MRN: [id number] DOB: 01/02/1926 Today's Date: 11/23/2016 Time:  -       Spoke with RN. Pt is a little more alert than this am. Decided to defer MBS until tomorrow. Recommend pt be NPO except for meds in upright position. Will check on pt tomorrow am.   Cranford Mon.Ed [employer] [phone]

## 2016-11-23 NOTE — Progress Notes (Signed)
PT Cancellation Note  Patient Details Name: Luke Montgomery MRN: [id number] DOB: Jan 12, 1926   Cancelled Treatment:    Reason Eval/Treat Not Completed: Other (comment) (Pt exhausted per nurse. Will check back as able. )   Denice Paradise 11/23/2016, 9:30 AM  [facility] [phone] (pager)

## 2016-11-23 NOTE — Progress Notes (Signed)
  Speech Language Pathology Treatment:    Patient Details Name: ADOLPHE FORTUNATO MRN: [id number] DOB: 01-30-1926 Today's Date: 11/23/2016 Time:  -     Assessment / Plan / Recommendation Clinical Impression  MBS scheduled for 9:30 today  HPI        SLP Plan          Recommendations                           GO                [name] 11/23/2016, 8:22 AM   [name].[employer] [phone]

## 2016-11-24 ENCOUNTER — Inpatient Hospital Stay (HOSPITAL_COMMUNITY): Payer: Medicare PPO

## 2016-11-24 DIAGNOSIS — Z880 Allergy status to penicillin: Secondary | ICD-10-CM

## 2016-11-24 DIAGNOSIS — R7881 Bacteremia: Secondary | ICD-10-CM

## 2016-11-24 DIAGNOSIS — Y95 Nosocomial condition: Secondary | ICD-10-CM

## 2016-11-24 DIAGNOSIS — F0390 Unspecified dementia without behavioral disturbance: Secondary | ICD-10-CM

## 2016-11-24 DIAGNOSIS — J189 Pneumonia, unspecified organism: Secondary | ICD-10-CM

## 2016-11-24 DIAGNOSIS — R233 Spontaneous ecchymoses: Secondary | ICD-10-CM

## 2016-11-24 DIAGNOSIS — Z885 Allergy status to narcotic agent status: Secondary | ICD-10-CM

## 2016-11-24 DIAGNOSIS — R000 Tachycardia, unspecified: Secondary | ICD-10-CM

## 2016-11-24 LAB — BLOOD CULTURE ID PANEL (REFLEXED)
ACINETOBACTER BAUMANNII: NOT DETECTED
CANDIDA ALBICANS: NOT DETECTED
CANDIDA PARAPSILOSIS: NOT DETECTED
Candida glabrata: NOT DETECTED
Candida krusei: NOT DETECTED
Candida tropicalis: NOT DETECTED
ENTEROBACTERIACEAE SPECIES: NOT DETECTED
ENTEROCOCCUS SPECIES: NOT DETECTED
Enterobacter cloacae complex: NOT DETECTED
Escherichia coli: NOT DETECTED
HAEMOPHILUS INFLUENZAE: NOT DETECTED
Klebsiella oxytoca: NOT DETECTED
Klebsiella pneumoniae: NOT DETECTED
LISTERIA MONOCYTOGENES: NOT DETECTED
METHICILLIN RESISTANCE: NOT DETECTED
NEISSERIA MENINGITIDIS: NOT DETECTED
Proteus species: NOT DETECTED
Pseudomonas aeruginosa: NOT DETECTED
SERRATIA MARCESCENS: NOT DETECTED
STAPHYLOCOCCUS AUREUS BCID: DETECTED — AB
STREPTOCOCCUS PNEUMONIAE: NOT DETECTED
STREPTOCOCCUS PYOGENES: NOT DETECTED
STREPTOCOCCUS SPECIES: NOT DETECTED
Staphylococcus species: DETECTED — AB
Streptococcus agalactiae: NOT DETECTED

## 2016-11-24 LAB — COMPREHENSIVE METABOLIC PANEL
ALK PHOS: 86 U/L (ref 38–126)
ALT: 137 U/L — ABNORMAL HIGH (ref 17–63)
AST: 56 U/L — AB (ref 15–41)
Albumin: 2.5 g/dL — ABNORMAL LOW (ref 3.5–5.0)
Anion gap: 10 (ref 5–15)
BILIRUBIN TOTAL: 1.4 mg/dL — AB (ref 0.3–1.2)
BUN: 26 mg/dL — AB (ref 6–20)
CALCIUM: 8.5 mg/dL — AB (ref 8.9–10.3)
CO2: 27 mmol/L (ref 22–32)
Chloride: 119 mmol/L — ABNORMAL HIGH (ref 101–111)
Creatinine, Ser: 1.25 mg/dL — ABNORMAL HIGH (ref 0.61–1.24)
GFR calc Af Amer: 57 mL/min — ABNORMAL LOW (ref >60)
GFR, EST NON AFRICAN AMERICAN: 49 mL/min — AB (ref >60)
GLUCOSE: 114 mg/dL — AB (ref 65–99)
POTASSIUM: 3.3 mmol/L — AB (ref 3.5–5.1)
Sodium: 156 mmol/L — ABNORMAL HIGH (ref 135–145)
TOTAL PROTEIN: 5.2 g/dL — AB (ref 6.5–8.1)

## 2016-11-24 LAB — CBC
HEMATOCRIT: 38.5 % — AB (ref 39.0–52.0)
Hemoglobin: 11.9 g/dL — ABNORMAL LOW (ref 13.0–17.0)
MCH: 30.2 pg (ref 26.0–34.0)
MCHC: 30.9 g/dL (ref 30.0–36.0)
MCV: 97.7 fL (ref 78.0–100.0)
PLATELETS: 87 10*3/uL — AB (ref 150–400)
RBC: 3.94 MIL/uL — ABNORMAL LOW (ref 4.22–5.81)
RDW: 15.3 % (ref 11.5–15.5)
WBC: 9.8 10*3/uL (ref 4.0–10.5)

## 2016-11-24 LAB — GLUCOSE, CAPILLARY: GLUCOSE-CAPILLARY: 146 mg/dL — AB (ref 65–99)

## 2016-11-24 MED ORDER — VANCOMYCIN HCL IN DEXTROSE 1-5 GM/200ML-% IV SOLN
1000.0000 mg | Freq: Once | INTRAVENOUS | Status: AC
  Administered 2016-11-24: 1000 mg via INTRAVENOUS
  Filled 2016-11-24: qty 200

## 2016-11-24 MED ORDER — RESOURCE THICKENUP CLEAR PO POWD: ORAL | Status: DC | PRN

## 2016-11-24 MED ORDER — VANCOMYCIN HCL IN DEXTROSE 1-5 GM/200ML-% IV SOLN
1000.0000 mg | INTRAVENOUS | Status: DC
  Administered 2016-11-25: 1000 mg via INTRAVENOUS
  Filled 2016-11-24: qty 200

## 2016-11-24 MED ORDER — LORAZEPAM 0.5 MG PO TABS: 0.5000 mg | ORAL_TABLET | Freq: Three times a day (TID) | ORAL | Status: DC | PRN

## 2016-11-24 MED ORDER — KCL IN DEXTROSE-NACL 20-5-0.2 MEQ/L-%-% IV SOLN
INTRAVENOUS | Status: DC
  Administered 2016-11-24 – 2016-11-25 (×2): via INTRAVENOUS
  Filled 2016-11-24 (×3): qty 1000

## 2016-11-24 NOTE — Progress Notes (Signed)
Patient ID: Luke Montgomery, male   DOB: 1926/01/26, 83 y.o.   MRN: [id number]          [facility]    Date of Admission:  11/10/2016           Day 4 vancomycin        Day 4 levofloxacin        Day 4 aztreonam       Reason for Consult: Automatic consultation for methicillin susceptible staph aureus bacteremia     Assessment: He has recurrent healthcare associated pneumonia. He has developed MSSA bacteremia here in the hospital, most likely from an IV site although both forearm IVs look okay. There was no clear evidence of endocarditis on a poor quality transthoracic echocardiogram 2 days ago. Given that this episode of bacteremia was detected very quickly I have a very low suspicion for endocarditis. I do not feel strongly that we need to proceed with transesophageal echocardiography. Given his penicillin allergy I will continue vancomycin and would also continue levofloxacin for his pneumonia. I do not feel he needs to continue aztreonam.   Plan: 1. Continue vancomycin and levofloxacin 2. Discontinue aztreonam 3. Repeat blood cultures   Principal Problem:   Bacteremia due to methicillin susceptible Staphylococcus aureus (MSSA) Active Problems:   HCAP (healthcare-associated pneumonia)   Dementia with behavioral disturbance   Syncope   Hx of pulmonary embolus   BPH (benign prostatic hyperplasia)   Confusion   Sepsis (HCC)   Hypotension   Thrombocytopenia (HCC)   Protein-calorie malnutrition, severe   . amantadine  100 mg Oral BID  . busPIRone  10 mg Oral BID WC  . carbamazepine  200 mg Oral BID WC  . citalopram  20 mg Oral Daily  . donepezil  10 mg Oral QHS  . enoxaparin (LOVENOX) injection  40 mg Subcutaneous Q24H  . mouth rinse  15 mL Mouth Rinse BID  . memantine  14 mg Oral Daily  . multivitamin with minerals  1 tablet Oral Daily  . tamsulosin  0.4 mg Oral QHS  . traZODone  50 mg Oral QHS    HPI: Luke Montgomery is a 83 y.o. male with  dementia. He was hospitalized with HCAP in May. He was readmitted on 11/19/2016 with worsening mental status. Chest x-ray revealed right-sided pneumonia. Blood cultures were obtained. He was started on broad empiric therapy with vancomycin, levofloxacin and aztreonam because of a penicillin allergy. Admission blood cultures were negative. He was initially afebrile but spiked a temperature yesterday to 102.6. Both sets of repeat blood cultures have grown MSSA.   Review of Systems: Review of Systems  Unable to perform ROS: Mental acuity    Past Medical History:  Diagnosis Date  . Dementia   . DVT (deep venous thrombosis) ([place])   . Pulmonary embolism [facility])     Social History  Substance Use Topics  . Smoking status: Never Smoker  . Smokeless tobacco: Never Used  . Alcohol use No    Family History  Problem Relation Age of Onset  . Family history unknown: Yes   Allergies  Allergen Reactions  . Dilaudid [Hydromorphone Hcl] Other (See Comments)    Out of his mind. Extremely confused.   Marland Kitchen Penicillins Other (See Comments)    Has patient had a PCN reaction causing immediate rash, facial/tongue/throat swelling, SOB or lightheadedness with hypotension: Yes Has patient had a PCN reaction causing severe rash involving mucus membranes or skin necrosis: Unknown Has patient  had a PCN reaction that required hospitalization Unknown Has patient had a PCN reaction occurring within the last 10 years: Unknown above answers are "NO", then may proceed with Cephalosporin use.     OBJECTIVE: Blood pressure (!) 150/87, pulse (!) 105, temperature 99.7 F (37.6 C), temperature source Axillary, resp. rate 15, height 5\' 8"  (1.727 m), weight 153 lb 7 oz (69.6 kg), SpO2 100 %.  Physical Exam  Constitutional:  He is resting quietly in bed. He will look at me but he does not answer questions.  Cardiovascular: Regular rhythm.   No murmur heard. He is tachycardic.  Pulmonary/Chest:  He is tachypneic.    Abdominal: Soft. He exhibits no distension. There is no tenderness.  Musculoskeletal: Normal range of motion. He exhibits no edema or tenderness.  Neurological: He is alert.  Skin: No rash noted.  Scattered ecchymoses on arms.    Lab Results Lab Results  Component Value Date   WBC 9.8 11/24/2016   HGB 11.9 (L) 11/24/2016   HCT 38.5 (L) 11/24/2016   MCV 97.7 11/24/2016   PLT 87 (L) 11/24/2016    Lab Results  Component Value Date   CREATININE 1.25 (H) 11/24/2016   BUN 26 (H) 11/24/2016   NA 156 (H) 11/24/2016   K 3.3 (L) 11/24/2016   CL 119 (H) 11/24/2016   CO2 27 11/24/2016    Lab Results  Component Value Date   ALT 137 (H) 11/24/2016   AST 56 (H) 11/24/2016   ALKPHOS 86 11/24/2016   BILITOT 1.4 (H) 11/24/2016     Microbiology: Recent Results (from the past 240 hour(s))  Urine culture     Status: None   Collection Time: 11/11/2016  1:56 PM  Result Value Ref Range Status   Specimen Description URINE, RANDOM  Final   Special Requests NONE  Final   Culture NO GROWTH  Final   Report Status 11/22/2016 FINAL  Final  Blood Culture (routine x 2)     Status: None (Preliminary result)   Collection Time: 11/06/2016  2:10 PM  Result Value Ref Range Status   Specimen Description BLOOD RIGHT ANTECUBITAL  Final   Special Requests   Final    BOTTLES DRAWN AEROBIC AND ANAEROBIC Blood Culture adequate volume   Culture NO GROWTH 2 DAYS  Final   Report Status PENDING  Incomplete  Blood Culture (routine x 2)     Status: None (Preliminary result)   Collection Time: 11/23/2016  2:15 PM  Result Value Ref Range Status   Specimen Description BLOOD LEFT ANTECUBITAL  Final   Special Requests IN PEDIATRIC BOTTLE Blood Culture adequate volume  Final   Culture NO GROWTH 2 DAYS  Final   Report Status PENDING  Incomplete  MRSA PCR Screening     Status: None   Collection Time: 11/22/16  7:28 AM  Result Value Ref Range Status   MRSA by PCR NEGATIVE NEGATIVE Final    Comment:        The  GeneXpert MRSA Assay (FDA approved for NASAL specimens only), is one component of a comprehensive MRSA colonization surveillance program. It is not intended to diagnose MRSA infection nor to guide or monitor treatment for MRSA infections.   Culture, blood (routine x 2)     Status: None (Preliminary result)   Collection Time: 11/23/16  4:39 PM  Result Value Ref Range Status   Specimen Description BLOOD RIGHT ARM  Final   Special Requests   Final    BOTTLES DRAWN AEROBIC  ONLY Blood Culture adequate volume   Culture  Setup Time   Final    GRAM POSITIVE COCCI IN CLUSTERS AEROBIC BOTTLE ONLY CRITICAL RESULT CALLED TO, READ BACK BY AND VERIFIED WITH: M TURNER,PHARMD AT 8295 11/24/16 BY L BENFIELD    Culture GRAM POSITIVE COCCI  Final   Report Status PENDING  Incomplete  Culture, blood (routine x 2)     Status: None (Preliminary result)   Collection Time: 11/23/16  4:39 PM  Result Value Ref Range Status   Specimen Description BLOOD RIGHT HAND  Final   Special Requests   Final    BOTTLES DRAWN AEROBIC ONLY Blood Culture adequate volume   Culture  Setup Time   Final    GRAM POSITIVE COCCI IN CLUSTERS AEROBIC BOTTLE ONLY    Culture GRAM POSITIVE COCCI  Final   Report Status PENDING  Incomplete  Blood Culture ID Panel (Reflexed)     Status: Abnormal   Collection Time: 11/23/16  4:39 PM  Result Value Ref Range Status   Enterococcus species NOT DETECTED NOT DETECTED Final   Listeria monocytogenes NOT DETECTED NOT DETECTED Final   Staphylococcus species DETECTED (A) NOT DETECTED Final    Comment: CRITICAL RESULT CALLED TO, READ BACK BY AND VERIFIED WITH: M TURNER,PHARMD AT 6213 11/24/16 BY L BENFIELD    Staphylococcus aureus DETECTED (A) NOT DETECTED Final    Comment: Methicillin (oxacillin) susceptible Staphylococcus aureus (MSSA). Preferred therapy is anti staphylococcal beta lactam antibiotic (Cefazolin or Nafcillin), unless clinically contraindicated. CRITICAL RESULT CALLED TO, READ  BACK BY AND VERIFIED WITH: Burman Foster AT 0865 11/24/16 BY L BENFIELD    Methicillin resistance NOT DETECTED NOT DETECTED Final   Streptococcus species NOT DETECTED NOT DETECTED Final   Streptococcus agalactiae NOT DETECTED NOT DETECTED Final   Streptococcus pneumoniae NOT DETECTED NOT DETECTED Final   Streptococcus pyogenes NOT DETECTED NOT DETECTED Final   Acinetobacter baumannii NOT DETECTED NOT DETECTED Final   Enterobacteriaceae species NOT DETECTED NOT DETECTED Final   Enterobacter cloacae complex NOT DETECTED NOT DETECTED Final   Escherichia coli NOT DETECTED NOT DETECTED Final   Klebsiella oxytoca NOT DETECTED NOT DETECTED Final   Klebsiella pneumoniae NOT DETECTED NOT DETECTED Final   Proteus species NOT DETECTED NOT DETECTED Final   Serratia marcescens NOT DETECTED NOT DETECTED Final   Haemophilus influenzae NOT DETECTED NOT DETECTED Final   Neisseria meningitidis NOT DETECTED NOT DETECTED Final   Pseudomonas aeruginosa NOT DETECTED NOT DETECTED Final   Candida albicans NOT DETECTED NOT DETECTED Final   Candida glabrata NOT DETECTED NOT DETECTED Final   Candida krusei NOT DETECTED NOT DETECTED Final   Candida parapsilosis NOT DETECTED NOT DETECTED Final   Candida tropicalis NOT DETECTED NOT DETECTED Final    Michel Bickers, MD [place] [facility] [phone] pager   [phone] cell 11/24/2016, 12:07 PM

## 2016-11-24 NOTE — Progress Notes (Signed)
Transported down for barium swallow. Stable.

## 2016-11-24 NOTE — Progress Notes (Signed)
PHARMACY - PHYSICIAN COMMUNICATION CRITICAL VALUE ALERT - BLOOD CULTURE IDENTIFICATION (BCID)  Results for orders placed or performed during the hospital encounter of 11/09/2016  Blood Culture ID Panel (Reflexed) (Collected: 11/23/2016  4:39 PM)  Result Value Ref Range   Enterococcus species NOT DETECTED NOT DETECTED   Listeria monocytogenes NOT DETECTED NOT DETECTED   Staphylococcus species DETECTED (A) NOT DETECTED   Staphylococcus aureus DETECTED (A) NOT DETECTED   Methicillin resistance NOT DETECTED NOT DETECTED   Streptococcus species NOT DETECTED NOT DETECTED   Streptococcus agalactiae NOT DETECTED NOT DETECTED   Streptococcus pneumoniae NOT DETECTED NOT DETECTED   Streptococcus pyogenes NOT DETECTED NOT DETECTED   Acinetobacter baumannii NOT DETECTED NOT DETECTED   Enterobacteriaceae species NOT DETECTED NOT DETECTED   Enterobacter cloacae complex NOT DETECTED NOT DETECTED   Escherichia coli NOT DETECTED NOT DETECTED   Klebsiella oxytoca NOT DETECTED NOT DETECTED   Klebsiella pneumoniae NOT DETECTED NOT DETECTED   Proteus species NOT DETECTED NOT DETECTED   Serratia marcescens NOT DETECTED NOT DETECTED   Haemophilus influenzae NOT DETECTED NOT DETECTED   Neisseria meningitidis NOT DETECTED NOT DETECTED   Pseudomonas aeruginosa NOT DETECTED NOT DETECTED   Candida albicans NOT DETECTED NOT DETECTED   Candida glabrata NOT DETECTED NOT DETECTED   Candida krusei NOT DETECTED NOT DETECTED   Candida parapsilosis NOT DETECTED NOT DETECTED   Candida tropicalis NOT DETECTED NOT DETECTED   83 year old male admitted with sepsis on 8/21. He was started on Vancomycin/aztreonam/levaquin, and de-escalated to Levaquin on 8/22. He had recurrent fevers 8/24 AM and his antibiotics were changed from Levaquin to Vancomycin/Aztreonam. He now has 2/2 blood cultures with Gram positive cocci and methicillin sensitive Staph aureus detected on BCID. He has a penicillin allergy that is reported as severe and  no documented cephalosporins in our EMR, so Vancomycin is appropriate coverage for now pending further investigation of his allergy and antibiotic histories.  Staph aureus bacteremia generates an automatic ID consult. Dr. Megan Salon was on the unit and he was informed of this patient.  The ID consult team will see this patient today.  Name of physician (or Provider) Contacted: Dr. Megan Salon   Changes to prescribed antibiotics required: Continue Vancomycin with PCN allergy. ID to formally consult on patient today.  Norva Riffle 11/24/2016  10:53 AM

## 2016-11-24 NOTE — NC FL2 (Signed)
[place] LEVEL OF CARE SCREENING TOOL     IDENTIFICATION  Patient Name: Luke Montgomery Birthdate: 06/16/1925 Sex: male Admission Date (Current Location): 11/10/2016  [facility] and [place] Number:  Herbalist and Address:  [facility] [place]. [facility], [place] [address] [place], [place] [address]      Provider Number: [id number]  Attending Physician Name and Address:  Florencia Reasons, MD  Relative Name and Phone Number:       Current Level of Care: Hospital Recommended Level of Care: [place] Prior Approval Number:    Date Approved/Denied:   PASRR Number: [id number]  Discharge Plan: SNF    Current Diagnoses: Patient Active Problem List   Diagnosis Date Noted  . Bacteremia due to methicillin susceptible Staphylococcus aureus (MSSA) 11/24/2016  . Protein-calorie malnutrition, severe 11/23/2016  . BPH (benign prostatic hyperplasia) 11/17/2016  . Confusion 11/05/2016  . Sepsis ([place]) 11/11/2016  . Hypotension 11/18/2016  . Thrombocytopenia ([place]) 11/19/2016  . HCAP (healthcare-associated pneumonia) 08/29/2016  . Hx of pulmonary embolus 08/29/2016  . Syncope 04/15/2016  . Dementia with behavioral disturbance 10/12/2015    Orientation RESPIRATION BLADDER Height & Weight     Self  O2 (Nasal canula 3 L. ) Incontinent, External catheter Weight: 153 lb 7 oz (69.6 kg) Height:  5\' 8"  (172.7 cm)  BEHAVIORAL SYMPTOMS/MOOD NEUROLOGICAL BOWEL NUTRITION STATUS   (None)  (Dementia with behavioral disturbance) Incontinent Diet (DYS 1. No V-8 juice.)  AMBULATORY STATUS COMMUNICATION OF NEEDS Skin   Extensive Assist Verbally (Incomprehensible speech) Skin abrasions, Other (Comment) (Excoriated, MASD, Rash.)                       Personal Care Assistance Level of Assistance  Bathing, Feeding, Dressing Bathing Assistance: Maximum assistance Feeding assistance: Maximum assistance Dressing Assistance: Maximum assistance     Functional  Limitations Info  Sight, Hearing, Speech Sight Info: Adequate Hearing Info: Adequate Speech Info: Impaired (Incomprehensible)    SPECIAL CARE FACTORS FREQUENCY  PT (By licensed PT), OT (By licensed OT), Blood pressure, Speech therapy     PT Frequency: 5 x week OT Frequency: 5 x week     Speech Therapy Frequency: 5 x week      Contractures Contractures Info: Not present    Additional Factors Info  Code Status, Allergies Code Status Info: DNR Allergies Info: Dilaudid (Hydromorphone Hcl), Penicillins           Current Medications (11/24/2016):  This is the current hospital active medication list Current Facility-Administered Medications  Medication Dose Route Frequency Provider Last Rate Last Dose  . acetaminophen (TYLENOL) tablet 650 mg  650 mg Oral Q6H PRN Rondel Jumbo, PA-C   650 mg at 11/23/16 2046   Or  . acetaminophen (TYLENOL) suppository 650 mg  650 mg Rectal Q6H PRN Rondel Jumbo, PA-C      . amantadine (SYMMETREL) capsule 100 mg  100 mg Oral BID Rondel Jumbo, PA-C   100 mg at 11/24/16 0915  . aztreonam (AZACTAM) 1 g in dextrose 5 % 50 mL IVPB  1 g Intravenous Q8H Otilio Miu, [place]   Stopped at 11/24/16 8182  . bisacodyl (DULCOLAX) suppository 10 mg  10 mg Rectal Daily PRN Rondel Jumbo, PA-C      . busPIRone (BUSPAR) tablet 10 mg  10 mg Oral BID WC Sharene Butters E, PA-C   10 mg at 11/24/16 9937  . carbamazepine (TEGRETOL) tablet 200 mg  200 mg Oral BID WC Rondel Jumbo, PA-C   200 mg at 11/24/16 4097  . citalopram (CELEXA) tablet 20 mg  20 mg Oral Daily Rondel Jumbo, PA-C   20 mg at 11/24/16 0915  . dextrose 5 % and 0.2 % NaCl with KCl 20 mEq infusion   Intravenous Continuous Florencia Reasons, MD 75 mL/hr at 11/24/16 1100    . donepezil (ARICEPT) tablet 10 mg  10 mg Oral QHS Rondel Jumbo, PA-C   10 mg at 11/23/16 2300  . enoxaparin (LOVENOX) injection 40 mg  40 mg Subcutaneous Q24H Florencia Reasons, MD   40 mg at 11/23/16 1404  . levofloxacin (LEVAQUIN) IVPB  500 mg  500 mg Intravenous Q48H [place], Valeda Malm, RPH   Stopped at 11/23/16 1504  . LORazepam (ATIVAN) tablet 0.5 mg  0.5 mg Oral TID PRN Florencia Reasons, MD      . magnesium hydroxide (MILK OF MAGNESIA) suspension 30 mL  30 mL Oral Daily PRN Rondel Jumbo, PA-C      . MEDLINE mouth rinse  15 mL Mouth Rinse BID Waldemar Dickens, MD   15 mL at 11/24/16 1000  . memantine (NAMENDA XR) 24 hr capsule 14 mg  14 mg Oral Daily Rondel Jumbo, PA-C   14 mg at 11/24/16 0915  . multivitamin with minerals tablet 1 tablet  1 tablet Oral Daily Florencia Reasons, MD   1 tablet at 11/24/16 0915  . ondansetron (ZOFRAN) tablet 4 mg  4 mg Oral Q6H PRN Rondel Jumbo, PA-C       Or  . ondansetron [facility]) injection 4 mg  4 mg Intravenous Q6H PRN Rondel Jumbo, PA-C      . RESOURCE THICKENUP CLEAR   Oral PRN Florencia Reasons, MD      . risperiDONE (RISPERDAL) tablet 0.5 mg  0.5 mg Oral Q4H PRN Rondel Jumbo, PA-C   0.5 mg at 11/24/2016 2223  . senna-docusate (Senokot-S) tablet 1 tablet  1 tablet Oral QHS PRN Rondel Jumbo, PA-C      . tamsulosin (FLOMAX) capsule 0.4 mg  0.4 mg Oral QHS Rondel Jumbo, PA-C   0.4 mg at 11/23/16 2300  . traZODone (DESYREL) tablet 50 mg  50 mg Oral QHS Rondel Jumbo, PA-C   50 mg at 11/23/16 2300  . vancomycin (VANCOCIN) IVPB 1000 mg/200 mL premix  1,000 mg Intravenous Once Florencia Reasons, MD 200 mL/hr at 11/24/16 1043 1,000 mg at 11/24/16 1043     Discharge Medications: Please see discharge summary for a list of discharge medications.  Relevant Imaging Results:  Relevant Lab Results:   Additional Information SS#: [id number]. From [facility].  Candie Chroman, LCSW

## 2016-11-24 NOTE — Evaluation (Signed)
Physical Therapy Evaluation Patient Details Name: Luke Montgomery MRN: [id number] DOB: July 15, 1925 Today's Date: 11/24/2016   History of Present Illness  Pt admit with sepsis, metabolic encephalopathy and Acute respiratory issues. Also has dementia with behavioral disturbance.   Clinical Impression  Pt admitted with above diagnosis. Pt currently with functional limitations due to the deficits listed below (see PT Problem List). Pt was able to transfer to recliner with +2 max assist with posterior lean and unsteady steps.  Will need SNF as A living will not be able to provide level of care that pt needs. Will follow acutely.   Pt will benefit from skilled PT to increase their independence and safety with mobility to allow discharge to the venue listed below.      Follow Up Recommendations SNF;Supervision/Assistance - 24 hour    Equipment Recommendations  None recommended by PT    Recommendations for Other Services       Precautions / Restrictions Precautions Precautions: Fall Precaution Comments: Daughter reports multiple falls in A living over the past year Restrictions Weight Bearing Restrictions: No      Mobility  Bed Mobility Overal bed mobility: Needs Assistance Bed Mobility: Supine to Sit     Supine to sit: +2 for physical assistance;Total assist (pt =15%)     General bed mobility comments: Pt initiated movement but did not assist past initiation of movement  Transfers Overall transfer level: Needs assistance Equipment used: 2 person hand held assist Transfers: Sit to/from [facility] Sit to Stand: Max assist;+2 physical assistance;Mod assist;From elevated surface Stand pivot transfers: Max assist;+2 physical assistance       General transfer comment: Pt stood with max assist with a significant posterior lean.  Pt was able to take pivotal steps around to chair but needed assist to weight shift and step and was very unsteady.    Ambulation/Gait                 Stairs            Wheelchair Mobility    Modified Rankin (Stroke Patients Only)       Balance Overall balance assessment: Needs assistance;History of Falls Sitting-balance support: Bilateral upper extremity supported;Feet supported Sitting balance-Leahy Scale: Poor Sitting balance - Comments: Needed min to mod assist to sit EOB.   Postural control: Posterior lean Standing balance support: Bilateral upper extremity supported;During functional activity Standing balance-Leahy Scale: Poor Standing balance comment: Max assist to stand statically with +2 assist and bil UE support.  Posterior lean significant.                             Pertinent Vitals/Pain Pain Assessment: Faces Faces Pain Scale: Hurts little more Pain Location: generalized Pain Descriptors / Indicators: Aching;Grimacing;Guarding Pain Intervention(s): Limited activity within patient's tolerance;Monitored during session;Repositioned  VSS  Home Living Family/patient expects to be discharged to:: Assisted living               Home Equipment:  (unsure and family not present) Additional Comments: Daughter concerned as pt has had several falls at A living and feels he needs higher level of care.     Prior Function Level of Independence: Needs assistance   Gait / Transfers Assistance Needed: A with transfers and was wheelchair bound PTA  ADL's / Homemaking Assistance Needed: A by nursing        Hand Dominance        Extremity/Trunk Assessment  Upper Extremity Assessment Upper Extremity Assessment: Defer to OT evaluation    Lower Extremity Assessment Lower Extremity Assessment: RLE deficits/detail;LLE deficits/detail RLE Deficits / Details: tightness noted and could not fully assess ROM due to extensor tone.  Pt would not move LE to command. LLE Deficits / Details: Significant extensor tone and so rigid could not provide ROM.  Pt would not move LE to command.     Cervical / Trunk Assessment Cervical / Trunk Assessment: Kyphotic  Communication   Communication: Expressive difficulties (unintelligible speech)  Cognition Arousal/Alertness: Lethargic Behavior During Therapy: Flat affect Overall Cognitive Status: History of cognitive impairments - at baseline                                        General Comments      Exercises     Assessment/Plan    PT Assessment Patient needs continued PT services  PT Problem List Decreased activity tolerance;Decreased balance;Decreased mobility;Decreased knowledge of use of DME;Decreased safety awareness;Decreased knowledge of precautions;Decreased range of motion;Decreased strength       PT Treatment Interventions DME instruction;Functional mobility training;Therapeutic activities;Therapeutic exercise;Balance training;Patient/family education    PT Goals (Current goals can be found in the Care Plan section)  Acute Rehab PT Goals Patient Stated Goal: unable to state PT Goal Formulation: Patient unable to participate in goal setting Time For Goal Achievement: 12/08/16 Potential to Achieve Goals: Good    Frequency Min 2X/week   Barriers to discharge Decreased caregiver support      Co-evaluation               AM-PAC PT "6 Clicks" Daily Activity  Outcome Measure Difficulty turning over in bed (including adjusting bedclothes, sheets and blankets)?: Unable Difficulty moving from lying on back to sitting on the side of the bed? : Unable Difficulty sitting down on and standing up from a chair with arms (e.g., wheelchair, bedside commode, etc,.)?: Unable Help needed moving to and from a bed to chair (including a wheelchair)?: Total Help needed walking in hospital room?: Total Help needed climbing 3-5 steps with a railing? : Total 6 Click Score: 6    End of Session Equipment Utilized During Treatment: Gait belt;Oxygen Activity Tolerance: Patient limited by fatigue Patient  left: in chair;with call bell/phone within reach;with chair alarm set Nurse Communication: Mobility status;Need for lift equipment PT Visit Diagnosis: Unsteadiness on feet (R26.81);Repeated falls (R29.6);Muscle weakness (generalized) (M62.81)    Time: 2671-2458 PT Time Calculation (min) (ACUTE ONLY): 10 min   Charges:   PT Evaluation $PT Eval Moderate Complexity: 1 Mod     PT G Codes:        Blima Jaimes,PT Acute Rehabilitation [phone] (pager)   Denice Paradise 11/24/2016, 10:47 AM

## 2016-11-24 NOTE — Progress Notes (Signed)
Pt is lethargic, dinner not given.

## 2016-11-24 NOTE — Progress Notes (Signed)
PROGRESS NOTE  Luke Montgomery UVO:[id number] DOB: [birth year] DOA: 11/17/2016 PCP: Deland Pretty, MD  HPI/Recap of past 24 hours:  On 3liter nasal cannula, he is not awake, spiking fever  Assessment/Plan: Active Problems:   Dementia with behavioral disturbance   Syncope   HCAP (healthcare-associated pneumonia)   Hx of pulmonary embolus   BPH (benign prostatic hyperplasia)   Confusion   Sepsis (HCC)   Hypotension   Thrombocytopenia (HCC)   Protein-calorie malnutrition, severe   Sepsis /acute hypoxic respiratory failure/metabolic encephalopathy/pneumonia/bacteremia presented on admission -Ct head no acute findings, neurology consulted, input appreciated - cxr with pulmonary edema, bilateral lower lung fields atelectasis vs pneumonia -ct chest consistent with right upper and lower lobe pneumonia, blood culture +gpc in clusters, infectious disease consulted. -currently on vanc and levaquin per infectious disease, will follow infectious disease recommendation.  Elevated lft: from spesis, from chf?  Treat sepsis with abx, on prn lasix, cmp improving,  ammonia level wnl  Thrombocytopenia: acute, from infection? Repeat in am, no sign of bleeding, does has some petechiae on bilateral lower extremities  Hypernatremia: likely due to poor oral intake, start d51/4saline with k Repeat lab in am  AKI on CKD III: cr 1.8 on admission, then cr 1.23 on 8/22, then 1.67 on 8/23  ua no infection,  Cr worsening, repeat bmp in am, renal dosing meds  Diastolic chf He also has signs of fluids overload, echo lvef55% with grade 1 diastolic dysfunction, monitor volume status prn lasix for now   H/o DVT/PE, he was taken off anticoagulation recently due to frequent falls  Dementia/FTT: per daughter , patient as baseline only oriented to self.  Daughter would consider palliative approach if patient does not improve or get worse.  Sever malnutrition: with muscle wasting and fat depletion on exam,     Diet per speech recommendation:  Dysphagia 2 (Fine chop);Nectar-thick liquid   Liquid Administration via: Cup;No straw Medication Administration: Crushed with puree Supervision: Patient able to self feed;Full supervision/cueing for compensatory strategies Compensations: Slow rate;Small sips/bites;Minimize environmental distractions;Lingual sweep for clearance of pocketing Postural Changes: Seated upright at 90 degrees   Code Status: DNR  Family Communication: patient and daughter at bedside  Disposition Plan: remain in stepdown, not improving Consider palliative care in the next 1-2 days if no improvement   Consultants:  Neurology  Critical care  Infectious disease  Procedures:  none  Antibiotics:  aztreonam from admission to 8/22  Vanc from admission  levaquin from admission    Objective: BP (!) 150/87 (BP Location: Right Arm)   Pulse (!) 105   Temp 99.7 F (37.6 C) (Axillary)   Resp 15   Ht 5\' 8"  (1.727 m)   Wt 69.6 kg (153 lb 7 oz)   SpO2 100%   BMI 23.33 kg/m   Intake/Output Summary (Last 24 hours) at 11/24/16 0743 Last data filed at 11/24/16 0650  Gross per 24 hour  Intake              200 ml  Output              400 ml  Net             -200 ml   Filed Weights   11/13/2016 1325 11/08/2016 1615  Weight: 72.2 kg (159 lb 2.8 oz) 69.6 kg (153 lb 7 oz)    Exam: Patient is examined daily including today on 11/24/2016, exams remain the same as of yesterday except that has changed  General:  Frail, demented elderly, on nasal cannula, not responding  Cardiovascular: RRR  Respiratory: crackles on right side, no wheezing, no rhonchi  Abdomen: Soft/ND/NT, positive BS  Musculoskeletal: mild pitting Edema bilateral lower extremities has resolved, petechiae bilateral lower extremities  Neuro: not alert today, does not follow commands  Data Reviewed: Basic Metabolic Panel:  Recent Labs Lab 11/24/2016 1300 11/22/16 0528 11/23/16 0549  11/24/16 0417  NA 149* 152* 151* 156*  K 4.5 3.6 3.5 3.3*  CL 114* 121* 119* 119*  CO2 18* 24 25 27   GLUCOSE 154* 106* 111* 114*  BUN 35* 32* 30* 26*  CREATININE 1.80* 1.23 1.67* 1.25*  CALCIUM 9.1 7.9* 8.2* 8.5*   Liver Function Tests:  Recent Labs Lab 11/24/2016 1300 11/22/16 0528 11/23/16 0549 11/24/16 0417  AST 115* 253* 91* 56*  ALT 83* 207* 170* 137*  ALKPHOS 103 82 87 86  BILITOT 1.4* 0.7 0.8 1.4*  PROT 6.2* 4.9* 5.3* 5.2*  ALBUMIN 3.4* 2.7* 2.7* 2.5*   No results for input(s): LIPASE, AMYLASE in the last 168 hours.  Recent Labs Lab 11/23/16 0549  AMMONIA 30   CBC:  Recent Labs Lab 11/28/2016 1300 11/20/2016 1626 11/22/16 0528 11/23/16 0549 11/24/16 0417  WBC 10.6*  --  8.0 8.8 9.8  NEUTROABS 7.8*  --   --   --   --   HGB 13.9  --  12.0* 12.1* 11.9*  HCT 43.9  --  38.5* 39.1 38.5*  MCV 95.4  --  96.7 94.9 97.7  PLT 87* 71* 81* 92* 87*   Cardiac Enzymes:   No results for input(s): CKTOTAL, CKMB, CKMBINDEX, TROPONINI in the last 168 hours. BNP (last 3 results) No results for input(s): BNP in the last 8760 hours.  ProBNP (last 3 results) No results for input(s): PROBNP in the last 8760 hours.  CBG:  Recent Labs Lab 11/23/16 0758  GLUCAP 118*    Recent Results (from the past 240 hour(s))  Urine culture     Status: None   Collection Time: 11/01/2016  1:56 PM  Result Value Ref Range Status   Specimen Description URINE, RANDOM  Final   Special Requests NONE  Final   Culture NO GROWTH  Final   Report Status 11/22/2016 FINAL  Final  Blood Culture (routine x 2)     Status: None (Preliminary result)   Collection Time: 11/09/2016  2:10 PM  Result Value Ref Range Status   Specimen Description BLOOD RIGHT ANTECUBITAL  Final   Special Requests   Final    BOTTLES DRAWN AEROBIC AND ANAEROBIC Blood Culture adequate volume   Culture NO GROWTH 2 DAYS  Final   Report Status PENDING  Incomplete  Blood Culture (routine x 2)     Status: None (Preliminary result)    Collection Time: 11/02/2016  2:15 PM  Result Value Ref Range Status   Specimen Description BLOOD LEFT ANTECUBITAL  Final   Special Requests IN PEDIATRIC BOTTLE Blood Culture adequate volume  Final   Culture NO GROWTH 2 DAYS  Final   Report Status PENDING  Incomplete  MRSA PCR Screening     Status: None   Collection Time: 11/22/16  7:28 AM  Result Value Ref Range Status   MRSA by PCR NEGATIVE NEGATIVE Final    Comment:        The GeneXpert MRSA Assay (FDA approved for NASAL specimens only), is one component of a comprehensive MRSA colonization surveillance program. It is not intended to diagnose MRSA infection  nor to guide or monitor treatment for MRSA infections.   Culture, blood (routine x 2)     Status: None (Preliminary result)   Collection Time: 11/23/16  4:39 PM  Result Value Ref Range Status   Specimen Description BLOOD RIGHT ARM  Final   Special Requests   Final    BOTTLES DRAWN AEROBIC ONLY Blood Culture adequate volume   Culture  Setup Time   Final    GRAM POSITIVE COCCI IN CLUSTERS AEROBIC BOTTLE ONLY Organism ID to follow    Culture GRAM POSITIVE COCCI  Final   Report Status PENDING  Incomplete  Culture, blood (routine x 2)     Status: None (Preliminary result)   Collection Time: 11/23/16  4:39 PM  Result Value Ref Range Status   Specimen Description BLOOD RIGHT HAND  Final   Special Requests   Final    BOTTLES DRAWN AEROBIC ONLY Blood Culture adequate volume   Culture  Setup Time   Final    GRAM POSITIVE COCCI IN CLUSTERS AEROBIC BOTTLE ONLY    Culture GRAM POSITIVE COCCI  Final   Report Status PENDING  Incomplete     Studies: Ct Chest Wo Contrast  Result Date: 11/23/2016 CLINICAL DATA:  Pneumonia. EXAM: CT CHEST WITHOUT CONTRAST TECHNIQUE: Multidetector CT imaging of the chest was performed following the standard protocol without IV contrast. COMPARISON:  Radiograph of same day. FINDINGS: Cardiovascular: Atherosclerosis of thoracic aorta is noted  without aneurysm formation. Mild cardiomegaly is noted. No pericardial effusion is noted. Mediastinum/Nodes: No enlarged mediastinal or axillary lymph nodes. Thyroid gland, trachea, and esophagus demonstrate no significant findings. Lungs/Pleura: No pneumothorax is noted. Mild left posterior basilar subsegmental atelectasis is noted. Right upper and lower lobe airspace opacities are noted concerning for pneumonia. Minimal right pleural effusion may be present. Upper Abdomen: No acute abnormality. Musculoskeletal: No chest wall mass or suspicious bone lesions identified. IMPRESSION: Mild cardiomegaly. Right upper and lower lobe airspace opacities are noted concerning for pneumonia with minimal associated pleural effusion. Aortic Atherosclerosis (ICD10-I70.0). Electronically Signed   By: Marijo Conception, M.D.   On: 11/23/2016 17:20   Dg Chest Port 1 View  Result Date: 11/23/2016 CLINICAL DATA:  Shortness of Breath EXAM: PORTABLE CHEST 1 VIEW COMPARISON:  11/22/2016 FINDINGS: Worsening airspace disease throughout the right lung. Patchy opacities in the left lung. Findings concerning for worsening multifocal pneumonia. Mild cardiomegaly. Small right pleural effusion. IMPRESSION: Worsening bilateral airspace disease, right greater than left concerning for multifocal pneumonia. Small right effusion. Electronically Signed   By: Rolm Baptise M.D.   On: 11/23/2016 08:35    Scheduled Meds: . amantadine  100 mg Oral BID  . busPIRone  10 mg Oral BID WC  . carbamazepine  200 mg Oral BID WC  . citalopram  20 mg Oral Daily  . donepezil  10 mg Oral QHS  . enoxaparin (LOVENOX) injection  40 mg Subcutaneous Q24H  . LORazepam  0.5 mg Oral TID  . mouth rinse  15 mL Mouth Rinse BID  . memantine  14 mg Oral Daily  . multivitamin with minerals  1 tablet Oral Daily  . tamsulosin  0.4 mg Oral QHS  . traZODone  50 mg Oral QHS    Continuous Infusions: . aztreonam Stopped (11/24/16 0157)  . levofloxacin (LEVAQUIN) IV  Stopped (11/23/16 1504)     Time spent: 68mins I have personally reviewed and interpreted on  11/24/2016 daily labs, tele strips, imagings as discussed above under date review session and  assessment and plans. Infectious disease consulted I have discussed plan of care as described above with RN , social worker on 11/24/2016   Mosiah Bastin MD, PhD  [facility] Pager [phone]. If 7PM-7AM, please contact night-coverage at [email or web address], password [facility] 11/24/2016, 7:43 AM  LOS: 3 days

## 2016-11-24 NOTE — Progress Notes (Signed)
Modified Barium Swallow Progress Note  Patient Details  Name: Luke Montgomery MRN: [id number] Date of Birth: [birth year]  Today's Date: 11/24/2016  Modified Barium Swallow completed.  Full report located under Chart Review in the Imaging Section.  Brief recommendations include the following:  Clinical Impression  Pt lethargic (scheduled Ativan) however awake and able to participate in MBS. Oral phase marked by delayed transit, decreased manipulation and lingual pumping. Did not attempt regular or mechanical soft solids due lethary and decreased awareness. Delayed swallow initation to the vallecuale with silent aspiration before the swallow with thin via straw. Mild vallecular residue given decreased epiglottic inversion. Although MBS does not diagnose below level of the UES, esophagus was scanned not revealing obvious abnormality. Risk of penetration and aspiration of cup sips thin is high, therefore recommend Dys 1 texture, nectar thick liquids, no straws, eat ONLY when alert and sit upright. ST will continue to intervene for safety with recommendations.     Swallow Evaluation Recommendations       SLP Diet Recommendations: Dysphagia 1 (Puree) solids;Nectar thick liquid   Liquid Administration via: Straw;No straw   Medication Administration: Crushed with puree   Supervision: Full supervision/cueing for compensatory strategies;Staff to assist with self feeding   Compensations: Minimize environmental distractions;Slow rate;Small sips/bites;Multiple dry swallows after each bite/sip   Postural Changes: Seated upright at 90 degrees;Remain semi-upright after after feeds/meals (Comment)   Oral Care Recommendations: Oral care BID        [name] 11/24/2016,11:32 AM   [name].Ed [employer] [phone]

## 2016-11-24 NOTE — Progress Notes (Addendum)
Pharmacy Antibiotic Note  Luke Montgomery is a 83 y.o. male de-escalated from vancomycin, aztreonam, and levaquin yesterday to just levaquin now with fever to 101.2. Pharmacy asked to resume aztreonam and Vancomycin. Cr fluctuating up 1.23 > 1.67 > 1.25 with CrCl ~ 39 ml/min.  Plan: 1) Aztreonam 1g IV q8 hours - now stopped 2) Vancomycin 1000mg  IV q 24 hours  Height: 5\' 8"  (172.7 cm) Weight: 153 lb 7 oz (69.6 kg) IBW/kg (Calculated) : 68.4  Temp (24hrs), Avg:100.8 F (38.2 C), Min:98.7 F (37.1 C), Max:102.6 F (39.2 C)   Recent Labs Lab 11/26/2016 1300 11/20/2016 1344 11/16/2016 1626 11/08/2016 1638 11/17/2016 1821 11/22/16 0528 11/23/16 0549 11/24/16 0417  WBC 10.6*  --   --   --   --  8.0 8.8 9.8  CREATININE 1.80*  --   --   --   --  1.23 1.67* 1.25*  LATICACIDVEN  --  7.07* 2.0* 1.98* 2.5*  --  1.4  --     Estimated Creatinine Clearance: 38.8 mL/min (A) (by C-G formula based on SCr of 1.25 mg/dL (H)).    Allergies  Allergen Reactions  . Dilaudid [Hydromorphone Hcl] Other (See Comments)    Out of his mind. Extremely confused.   Marland Kitchen Penicillins Other (See Comments)    Has patient had a PCN reaction causing immediate rash, facial/tongue/throat swelling, SOB or lightheadedness with hypotension: Yes Has patient had a PCN reaction causing severe rash involving mucus membranes or skin necrosis: Unknown Has patient had a PCN reaction that required hospitalization Unknown Has patient had a PCN reaction occurring within the last 10 years: Unknown above answers are "NO", then may proceed with Cephalosporin use.     Antimicrobials this admission: Vancomycin 8/21>>8/22, resume 8/24>> Aztreo 8/21>>8/22, resume 8/23 >>8/24 Levaquin 8/21>>  Dose adjustments this admission: n/a  Microbiology results: 8/21: UC: negative 8/21: BC x 2>> GPC  Thank you for allowing pharmacy to be a part of this patient's care.  Jodean Lima Rudisill 11/24/2016 8:41 AM

## 2016-11-24 NOTE — Clinical Social Work Placement (Signed)
   CLINICAL SOCIAL WORK PLACEMENT  NOTE  Date:  11/24/2016  Patient Details  Name: Luke Montgomery MRN: [id number] Date of Birth: Apr 10, 1925  Clinical Social Work is seeking post-discharge placement for this patient at the [place] level of care (*CSW will initial, date and re-position this form in  chart as items are completed):  Yes   Patient/family provided with [place] Work Department's list of facilities offering this level of care within the geographic area requested by the patient (or if unable, by the patient's family).  Yes   Patient/family informed of their freedom to choose among providers that offer the needed level of care, that participate in Medicare, Medicaid or managed care program needed by the patient, have an available bed and are willing to accept the patient.  Yes   Patient/family informed of [facility]'s ownership interest in [facility] and [facility], as well as of the fact that they are under no obligation to receive care at these facilities.  PASRR submitted to EDS on 11/24/16     PASRR number received on       Existing PASRR number confirmed on 11/24/16     FL2 transmitted to all facilities in geographic area requested by pt/family on 11/24/16     FL2 transmitted to all facilities within larger geographic area on       Patient informed that his/her managed care company has contracts with or will negotiate with certain facilities, including the following:            Patient/family informed of bed offers received.  Patient chooses bed at       Physician recommends and patient chooses bed at      Patient to be transferred to   on  .  Patient to be transferred to facility by       Patient family notified on   of transfer.  Name of family member notified:        PHYSICIAN Please sign FL2     Additional Comment:    _______________________________________________ Candie Chroman, LCSW 11/24/2016,  11:38 AM

## 2016-11-25 LAB — COMPREHENSIVE METABOLIC PANEL
ALBUMIN: 2.3 g/dL — AB (ref 3.5–5.0)
ALT: 94 U/L — AB (ref 17–63)
ANION GAP: 7 (ref 5–15)
AST: 38 U/L (ref 15–41)
Alkaline Phosphatase: 76 U/L (ref 38–126)
BUN: 30 mg/dL — AB (ref 6–20)
CHLORIDE: 122 mmol/L — AB (ref 101–111)
CO2: 24 mmol/L (ref 22–32)
Calcium: 8 mg/dL — ABNORMAL LOW (ref 8.9–10.3)
Creatinine, Ser: 1.32 mg/dL — ABNORMAL HIGH (ref 0.61–1.24)
GFR calc Af Amer: 53 mL/min — ABNORMAL LOW (ref >60)
GFR calc non Af Amer: 46 mL/min — ABNORMAL LOW (ref >60)
GLUCOSE: 144 mg/dL — AB (ref 65–99)
POTASSIUM: 4.7 mmol/L (ref 3.5–5.1)
SODIUM: 153 mmol/L — AB (ref 135–145)
TOTAL PROTEIN: 4.8 g/dL — AB (ref 6.5–8.1)
Total Bilirubin: 1.9 mg/dL — ABNORMAL HIGH (ref 0.3–1.2)

## 2016-11-25 LAB — CBC
HCT: 35.8 % — ABNORMAL LOW (ref 39.0–52.0)
HEMOGLOBIN: 11.4 g/dL — AB (ref 13.0–17.0)
MCH: 30.9 pg (ref 26.0–34.0)
MCHC: 31.8 g/dL (ref 30.0–36.0)
MCV: 97 fL (ref 78.0–100.0)
PLATELETS: 91 10*3/uL — AB (ref 150–400)
RBC: 3.69 MIL/uL — AB (ref 4.22–5.81)
RDW: 15.7 % — ABNORMAL HIGH (ref 11.5–15.5)
WBC: 11.6 10*3/uL — AB (ref 4.0–10.5)

## 2016-11-25 LAB — PROCALCITONIN: PROCALCITONIN: 0.84 ng/mL

## 2016-11-25 MED ORDER — DEXTROSE 5 % IV SOLN
INTRAVENOUS | Status: DC
  Administered 2016-11-25: 110 mL via INTRAVENOUS

## 2016-11-26 LAB — CULTURE, BLOOD (ROUTINE X 2)
Culture: NO GROWTH
Culture: NO GROWTH
SPECIAL REQUESTS: ADEQUATE
SPECIAL REQUESTS: ADEQUATE
Special Requests: ADEQUATE
Special Requests: ADEQUATE

## 2016-12-02 NOTE — Progress Notes (Signed)
Received call from central tele that pt's heart rate increased for 4 beats then was decreasing into the 30's. When I got to pt he did not have a pulse. Pt was a DNR. Paged Dr Erlinda Hong. MD asked me to call daughter Jenny Reichmann. Jenny Reichmann was not sure if she wanted to come up and see her father and stated she would call me back when she decided.

## 2016-12-02 NOTE — Progress Notes (Signed)
Pt's granddaughter called to tell me family would not be coming up to the hospital to see her grandfather. She stated they did not want his clothes and wanted me to dispose of them. She also gave me the name of the funeral home they wanted pt taken to.

## 2016-12-02 NOTE — Progress Notes (Signed)
  Speech Language Pathology Treatment: Dysphagia  Patient Details Name: Luke Montgomery MRN: [id number] DOB: March 19, 1926 Today's Date: December 24, 2016 Time: 9449-6759 SLP Time Calculation (min) (ACUTE ONLY): 45 min  Assessment / Plan / Recommendation Clinical Impression  Patient seen to address dysphagia goals with current recommended diet of Dys 1 (puree) solids and nectar thick liquids. Upon entering room, patient had eyes open, sitting upright in bed, but had a blank stare and did not react to SLP's voice, tactile cues. He started to become more alert and interactive after SLP started oral care with toothette sponge. Patient with dried blood on tongue and in oral cavity and per RN who was present during majority of this session, he apparently bites/chews on his tongue (appears to be premorbid as dried blood looks relatively old). Following oral care, patient then opened mouth to accept spoonfuls of nectar thick orange juice and puree fruit, which RN had mixed his crushed medications into. Patient exhibited prolonged oral transit of puree solids, delayed swallow initiation, laryngeal pumping. He initiated swallow and cleared oral cavity more promptly when given spoonful of liquids after bite of puree solids. No changes in vital signs, but after approximately 25 minutes of PO intake, patient appeared to be getting fatigued.      HPI HPI: Luke Pape Hyattis a 83 y.o.malewith medical history significant foradvanced dementia, from a memory care unit, recent pna per dtr, DVT and PE brought to the ED with acute altered mental status. CT of the head was negative for acute findings or bleeding. Found to be hypotensivewith troponin of 1.28, shock, acute hypoxic respiratory failure, possible sepsis. CXR CHF with slight interval improvement in interstitial edema. Bibasilar atelectasis or pneumonia with pleural effusions.      SLP Plan  Continue with current plan of care       Recommendations  Diet  recommendations: Dysphagia 1 (puree);Nectar-thick liquid Liquids provided via: Teaspoon;Cup Medication Administration: Crushed with puree Supervision: Full supervision/cueing for compensatory strategies;Trained caregiver to feed patient;Staff to assist with self feeding Compensations: Minimize environmental distractions;Slow rate;Small sips/bites;Multiple dry swallows after each bite/sip;Follow solids with liquid Postural Changes and/or Swallow Maneuvers: Seated upright 90 degrees                Follow up Recommendations: Skilled Nursing facility SLP Visit Diagnosis: Dysphagia, oropharyngeal phase (R13.12) Plan: Continue with current plan of care       Sonia Baller, [place], CCC-SLP 2016/12/24 11:21 AM

## 2016-12-02 NOTE — Progress Notes (Signed)
[place] [facility] called, spoke with Luke Montgomery. Ref number [id number]. Waiting for family to come see pt and gather belongings.

## 2016-12-02 NOTE — Discharge Summary (Signed)
Discharge Summary  [name] RCV:[id number] DOB: 05-28-1925  PCP: Deland Pretty, MD  Admit date: Dec 20, 2016 Time of Death:  11:30am  December 24, 2016  Time spent: >66mins, more than 50% time spent on coordination of care   Discharge Diagnoses:  Active Hospital Problems   Diagnosis Date Noted  . Bacteremia due to methicillin susceptible Staphylococcus aureus (MSSA) 11/24/2016  . Protein-calorie malnutrition, severe 11/23/2016  . BPH (benign prostatic hyperplasia) December 20, 2016  . Confusion Dec 20, 2016  . Sepsis ([place]) December 20, 2016  . Hypotension 2016/12/20  . Thrombocytopenia ([place]) December 20, 2016  . Hx of pulmonary embolus 08/29/2016  . HCAP (healthcare-associated pneumonia) 08/29/2016  . Syncope 04/15/2016  . Dementia with behavioral disturbance 10/12/2015    Resolved Hospital Problems   Diagnosis Date Noted Date Resolved  No resolved problems to display.     Filed Weights   Dec 20, 2016 1325 12/20/16 1615  Weight: 72.2 kg (159 lb 2.8 oz) 69.6 kg (153 lb 7 oz)    History of present illness:  PCP: Deland Pretty, MD   Patient coming from:  Home    Chief Complaint: Confusion  LEvel V Caveat due to confusion  HPI: Luke Montgomery is a 83 y.o. male with medical history significant for advanced dementia, leaving in the memory care unit, history of DVT and PE on Xarelto, brought to the ED with acute altered mental status. He is wheelchair bound, and has frequent falls.On the EMS transport he briefly stopped breathing, requiring ventilation mask. He initially was evaluated as a code stroke, but a CT of the head was negative for acute findings or  Bleeding. He was also hypotensive with blood pressure in the 60s. He is DNR/DNI and family request no pressors. In addition, he was found to have a troponin of 1.28, for which cardiology was contacted, but no indication for aggressive measures were recommended. At the ED, he was given IV fluids, with improvement of his blood pressure, and further work  up followed. History is obtained by family, as patient is very confused in the setting of dementia.. After history cannot be obtained due to the patient's condition. Family reports that he is more confused than his baseline. He denied appear to have any cardiac or respiratory issues prior to these admission, and they are not a waiver of any sick contacts. Patient is very weak.  ED Course:  BP 98/66   Pulse (!) 103   Temp 98.9 F (37.2 C) (Rectal)   Resp (!) 21   Wt 72.2 kg (159 lb 2.8 oz)   SpO2 100%   BMI 23.51 kg/m   CT head head negative  sodium 149 potassium 4.5 chloride 114 CO2 18  glucose 154 creatinine 1.8,  AG 17  troponin 1.28  lactic acid 7.07  white count 10.6 hemoglobin 13.9  platelets 87,000 chest x-ray possible RLL infiltrate receiving IV vancomycin, IV Levaquin and IV Azactam  Due to encephalopathy and hypoxia requiring NRB , neurology and critical care consulted initially on presentation. He is found to have pneumonia and staph bacteremia, infectious disease consulted, abx adjustment per ID.  from infection and hypoxia stand point , patient seems to be improving, fever subsided, lactic acidosis resolved, procalcitonin trended down.  he initially required NRB, this is weaned to [facility] on 25-Dec-2022, he seems to be more awake on Dec 25, 2022 am and able to participate with swallow eval. However, RN noticed sudden tachycardia then bradycardia on tele when patient is sleeping, RN went in to check on patient , patient has expired at 11:30am.  I have talked to daughter over the phone.  Hospital Course:  Principal Problem:   Bacteremia due to methicillin susceptible Staphylococcus aureus (MSSA) Active Problems:   Dementia with behavioral disturbance   Syncope   HCAP (healthcare-associated pneumonia)   Hx of pulmonary embolus   BPH (benign prostatic hyperplasia)   Confusion   Sepsis (HCC)   Hypotension   Thrombocytopenia (H[facility])   Protein-calorie malnutrition, severe   Sepsis  /acute hypoxic respiratory failure/metabolic encephalopathy/pneumonia/bacteremia presented on admission -Ct head no acute findings, neurology consulted, input appreciated - cxr with pulmonary edema, bilateral lower lung fields atelectasis vs pneumonia -ct chest consistent with right upper and lower lobe pneumonia, blood culture +Staph, infectious disease consulted.   Elevated lft: from spesis, from chf?  Treat sepsis with abx, on prn lasix, cmp improving. ammonia level wnl  Thrombocytopenia: acute, from infection? Repeat in am, no sign of bleeding, does has some petechiae on bilateral lower extremities which is improving  Hypernatremia: likely due to poor oral intake, start d51/4saline with k on 8/24 Repeat lab in am, sodium remain elevated, change ivf to d5  AKI on CKD III: cr 1.8 on admission, then cr 1.23 on 8/22, then 1.67 on 8/23  ua no infection,  Cr 3.29 on 9/24  Diastolic chf He also has signs of fluids overload, echo lvef55% with grade 1 diastolic dysfunction, monitor volume status prn lasix for now   H/o DVT/PE, he was taken off anticoagulation recently prior to this hospitalization due to frequent falls  Dementia/FTT: per daughter , patient as baseline only oriented to self.  Daughter would consider palliative approach if patient does not improve or get worse.  Sever malnutrition: with muscle wasting and fat depletion on exam   Code Status: DNR  Family Communication:  daughter at bedside on 8/22, daughter over the phone today    Consultants:  Neurology  Critical care  Infectious disease  Procedures:  none  Antibiotics:  aztreonam from admission to 8/22  Vanc from admission  levaquin from admission      Allergies  Allergen Reactions  . Dilaudid [Hydromorphone Hcl] Other (See Comments)    Out of his mind. Extremely confused.   Marland Kitchen Penicillins Other (See Comments)    Has patient had a PCN reaction causing immediate rash,  facial/tongue/throat swelling, SOB or lightheadedness with hypotension: Yes Has patient had a PCN reaction causing severe rash involving mucus membranes or skin necrosis: Unknown Has patient had a PCN reaction that required hospitalization Unknown Has patient had a PCN reaction occurring within the last 10 years: Unknown above answers are "NO", then may proceed with Cephalosporin use.       The results of significant diagnostics from this hospitalization (including imaging, microbiology, ancillary and laboratory) are listed below for reference.    Significant Diagnostic Studies: X-ray Chest Pa And Lateral  Result Date: 11/22/2016 CLINICAL DATA:  Sepsis, healthcare associated pneumonia, CHF, history of pulmonary embolism. EXAM: CHEST  2 VIEW COMPARISON:  Portable chest x-ray of November 21, 2016 FINDINGS: The lungs are reasonably well inflated. The interstitial markings remain increased but have improved slightly. There is persistent confluent density just above the right hemidiaphragm. The cardiac silhouette is enlarged. The central pulmonary vascularity is prominent. There is calcification in the wall of the aortic arch. There is a small right pleural effusion and trace left pleural effusion. There are degenerative changes of both shoulders. IMPRESSION: CHF with slight interval improvement in interstitial edema. Bibasilar atelectasis or pneumonia with pleural effusions. Thoracic aortic atherosclerosis.  Electronically Signed   By: David  Martinique M.D.   On: 11/22/2016 07:26   Ct Chest Wo Contrast  Result Date: 11/23/2016 CLINICAL DATA:  Pneumonia. EXAM: CT CHEST WITHOUT CONTRAST TECHNIQUE: Multidetector CT imaging of the chest was performed following the standard protocol without IV contrast. COMPARISON:  Radiograph of same day. FINDINGS: Cardiovascular: Atherosclerosis of thoracic aorta is noted without aneurysm formation. Mild cardiomegaly is noted. No pericardial effusion is noted.  Mediastinum/Nodes: No enlarged mediastinal or axillary lymph nodes. Thyroid gland, trachea, and esophagus demonstrate no significant findings. Lungs/Pleura: No pneumothorax is noted. Mild left posterior basilar subsegmental atelectasis is noted. Right upper and lower lobe airspace opacities are noted concerning for pneumonia. Minimal right pleural effusion may be present. Upper Abdomen: No acute abnormality. Musculoskeletal: No chest wall mass or suspicious bone lesions identified. IMPRESSION: Mild cardiomegaly. Right upper and lower lobe airspace opacities are noted concerning for pneumonia with minimal associated pleural effusion. Aortic Atherosclerosis (ICD10-I70.0). Electronically Signed   By: Marijo Conception, M.D.   On: 11/23/2016 17:20   Dg Chest Port 1 View  Result Date: 11/23/2016 CLINICAL DATA:  Shortness of Breath EXAM: PORTABLE CHEST 1 VIEW COMPARISON:  11/22/2016 FINDINGS: Worsening airspace disease throughout the right lung. Patchy opacities in the left lung. Findings concerning for worsening multifocal pneumonia. Mild cardiomegaly. Small right pleural effusion. IMPRESSION: Worsening bilateral airspace disease, right greater than left concerning for multifocal pneumonia. Small right effusion. Electronically Signed   By: Rolm Baptise M.D.   On: 11/23/2016 08:35   Dg Chest Portable 1 View  Result Date: 11/05/2016 CLINICAL DATA:  Hypoxia, altered mental status, unresponsive EXAM: PORTABLE CHEST 1 VIEW COMPARISON:  08/29/2016 FINDINGS: There is bilateral diffuse interstitial thickening. There is right lower lobe airspace disease concerning for pneumonia. There is no pleural effusion or pneumothorax. There is stable cardiomegaly. There is no acute osseous abnormality. There is loss of the normal acromion humeral distance bilaterally as can be seen with chronic rotator cuff tears. IMPRESSION: Mild CHF. Right lower lobe airspace disease concerning for pneumonia. Followup PA and lateral chest X-ray is  recommended in 3-4 weeks following trial of antibiotic therapy to ensure resolution and exclude underlying malignancy. Electronically Signed   By: Kathreen Devoid   On: 11/20/2016 13:55   Dg Swallowing Func-speech Pathology  Result Date: 11/24/2016 Objective Swallowing Evaluation: Type of Study: MBS-Modified Barium Swallow Study Patient Details Name: TARIQ PERNELL MRN: [id number] Date of Birth: 01-09-1926 Today's Date: 11/24/2016 Time: SLP Start Time (ACUTE ONLY): 0953-SLP Stop Time (ACUTE ONLY): 1016 SLP Time Calculation (min) (ACUTE ONLY): 23 min Past Medical History: Past Medical History: Diagnosis Date . Dementia  . DVT (deep venous thrombosis) ([place])  . Pulmonary embolism [facility])  Past Surgical History: Past Surgical History: Procedure Laterality Date . HERNIA REPAIR   . ROTATOR CUFF REPAIR   . SMALL INTESTINE SURGERY    secondary to removal of recurrent polyp . TRANSURETHRAL RESECTION OF PROSTATE   HPI: Mikaele Stecher Hyattis a 83 y.o.malewith medical history significant foradvanced dementia, from a memory care unit, recent pna per dtr, DVT and PE brought to the ED with acute altered mental status. CT of the head was negative for acute findings or bleeding. Found to be hypotensivewith troponin of 1.28, shock, acute hypoxic respiratory failure, possible sepsis. CXR CHF with slight interval improvement in interstitial edema. Bibasilar atelectasis or pneumonia with pleural effusions. No Data Recorded Assessment / Plan / Recommendation CHL IP CLINICAL IMPRESSIONS 11/24/2016 Clinical Impression Pt lethargic (scheduled Ativan) however awake  and able to participate in MBS. Oral phase marked by delayed transit, decreased manipulation and lingual pumping. Did not attempt regular or mechanical soft solids due lethary and decreased awareness. Delayed swallow initation to the vallecuale with silent aspiration before the swallow with thin via straw. Mild vallecular residue given decreased epiglottic inversion. Although MBS does  not diagnose below level of the UES, esophagus was scanned not revealing obvious abnormality. Risk of penetration and aspiration of cup sips thin is high, therefore recommend Dys 1 texture, nectar thick liquids, no straws, eat ONLY when alert and sit upright. ST will continue to intervene for safety with recommendations.   SLP Visit Diagnosis Dysphagia, oropharyngeal phase (R13.12) Attention and concentration deficit following -- Frontal lobe and executive function deficit following -- Impact on safety and function (No Data)   CHL IP TREATMENT RECOMMENDATION 11/24/2016 Treatment Recommendations Therapy as outlined in treatment plan below   Prognosis 11/24/2016 Prognosis for Safe Diet Advancement Fair Barriers to Reach Goals -- Barriers/Prognosis Comment -- CHL IP DIET RECOMMENDATION 11/24/2016 SLP Diet Recommendations Dysphagia 1 (Puree) solids;Nectar thick liquid Liquid Administration via Straw;No straw Medication Administration Crushed with puree Compensations Minimize environmental distractions;Slow rate;Small sips/bites;Multiple dry swallows after each bite/sip Postural Changes Seated upright at 90 degrees;Remain semi-upright after after feeds/meals (Comment)   CHL IP OTHER RECOMMENDATIONS 11/24/2016 Recommended Consults -- Oral Care Recommendations Oral care BID Other Recommendations --   CHL IP FOLLOW UP RECOMMENDATIONS 11/24/2016 Follow up Recommendations (No Data)   CHL IP FREQUENCY AND DURATION 11/24/2016 Speech Therapy Frequency (ACUTE ONLY) min 2x/week Treatment Duration 2 weeks      CHL IP ORAL PHASE 11/24/2016 Oral Phase Impaired Oral - Pudding Teaspoon -- Oral - Pudding Cup -- Oral - Honey Teaspoon -- Oral - Honey Cup Delayed oral transit;Weak lingual manipulation;Lingual pumping Oral - Nectar Teaspoon -- Oral - Nectar Cup Delayed oral transit;Weak lingual manipulation Oral - Nectar Straw Delayed oral transit;Weak lingual manipulation Oral - Thin Teaspoon -- Oral - Thin Cup Delayed oral transit;Weak lingual  manipulation Oral - Thin Straw Delayed oral transit;Weak lingual manipulation Oral - Puree Lingual pumping;Weak lingual manipulation;Delayed oral transit Oral - Mech Soft -- Oral - Regular -- Oral - Multi-Consistency -- Oral - Pill -- Oral Phase - Comment --  CHL IP PHARYNGEAL PHASE 11/24/2016 Pharyngeal Phase Impaired Pharyngeal- Pudding Teaspoon -- Pharyngeal -- Pharyngeal- Pudding Cup -- Pharyngeal -- Pharyngeal- Honey Teaspoon -- Pharyngeal -- Pharyngeal- Honey Cup Delayed swallow initiation-vallecula Pharyngeal -- Pharyngeal- Nectar Teaspoon -- Pharyngeal -- Pharyngeal- Nectar Cup Delayed swallow initiation-vallecula;Pharyngeal residue - valleculae Pharyngeal -- Pharyngeal- Nectar Straw Delayed swallow initiation-vallecula;Pharyngeal residue - valleculae Pharyngeal -- Pharyngeal- Thin Teaspoon -- Pharyngeal -- Pharyngeal- Thin Cup Delayed swallow initiation-vallecula Pharyngeal Material does not enter airway Pharyngeal- Thin Straw Penetration/Aspiration before swallow;Delayed swallow initiation-vallecula Pharyngeal Material enters airway, passes BELOW cords without attempt by patient to eject out (silent aspiration) Pharyngeal- Puree WFL Pharyngeal -- Pharyngeal- Mechanical Soft -- Pharyngeal -- Pharyngeal- Regular -- Pharyngeal -- Pharyngeal- Multi-consistency -- Pharyngeal -- Pharyngeal- Pill -- Pharyngeal -- Pharyngeal Comment --  CHL IP CERVICAL ESOPHAGEAL PHASE 11/24/2016 Cervical Esophageal Phase WFL Pudding Teaspoon -- Pudding Cup -- Honey Teaspoon -- Honey Cup -- Nectar Teaspoon -- Nectar Cup -- Nectar Straw -- Thin Teaspoon -- Thin Cup -- Thin Straw -- Puree -- Mechanical Soft -- Regular -- Multi-consistency -- Pill -- Cervical Esophageal Comment -- No flowsheet data found. [name] 11/24/2016, 11:32 AM  [name].Ed [employer] [phone]  Ct Head Code Stroke Wo Contrast  Result Date: 11/02/2016 CLINICAL DATA:  Code stroke. Patient unresponsive. Last seen normal at  11 a.m. EXAM: CT HEAD WITHOUT CONTRAST TECHNIQUE: Contiguous axial images were obtained from the base of the skull through the vertex without intravenous contrast. COMPARISON:  CT head, most recent, 10/11/2016. FINDINGS: Brain: No evidence for acute infarction, hemorrhage, mass lesion, hydrocephalus, or extra-axial fluid. Extensive atrophy. BILATERAL hypoattenuation of white matter, likely small vessel disease. Chronic appearing subcortical infarct RIGHT posterior frontal lobe, unchanged from priors. Vascular: Calcification of the cavernous internal carotid arteries consistent with cerebrovascular atherosclerotic disease. No signs of intracranial large vessel occlusion. Skull: Normal. Negative for fracture or focal lesion. Sinuses/Orbits: No acute finding. Other: None. ASPECTS [facility] Stroke Program Early CT Score) - Ganglionic level infarction (caudate, lentiform nuclei, internal capsule, insula, M1-M3 cortex): 7 - Supraganglionic infarction (M4-M6 cortex): 3 Total score (0-10 with 10 being normal): 10 IMPRESSION: 1. Atrophy and small vessel disease. No areas of acute infarction are identified. 2. ASPECTS is 10. These results were called by telephone at the time of interpretation on 11/19/2016 at 1:30 pm to Dr. Rory Percy , who verbally acknowledged these results. Electronically Signed   By: Staci Righter M.D.   On: 11/05/2016 13:38    Microbiology: Recent Results (from the past 240 hour(s))  Urine culture     Status: None   Collection Time: 11/09/2016  1:56 PM  Result Value Ref Range Status   Specimen Description URINE, RANDOM  Final   Special Requests NONE  Final   Culture NO GROWTH  Final   Report Status 11/22/2016 FINAL  Final  Blood Culture (routine x 2)     Status: None (Preliminary result)   Collection Time: 11/07/2016  2:10 PM  Result Value Ref Range Status   Specimen Description BLOOD RIGHT ANTECUBITAL  Final   Special Requests   Final    BOTTLES DRAWN AEROBIC AND ANAEROBIC Blood Culture adequate  volume   Culture NO GROWTH 4 DAYS  Final   Report Status PENDING  Incomplete  Blood Culture (routine x 2)     Status: None (Preliminary result)   Collection Time: 11/05/2016  2:15 PM  Result Value Ref Range Status   Specimen Description BLOOD LEFT ANTECUBITAL  Final   Special Requests IN PEDIATRIC BOTTLE Blood Culture adequate volume  Final   Culture NO GROWTH 4 DAYS  Final   Report Status PENDING  Incomplete  MRSA PCR Screening     Status: None   Collection Time: 11/22/16  7:28 AM  Result Value Ref Range Status   MRSA by PCR NEGATIVE NEGATIVE Final    Comment:        The GeneXpert MRSA Assay (FDA approved for NASAL specimens only), is one component of a comprehensive MRSA colonization surveillance program. It is not intended to diagnose MRSA infection nor to guide or monitor treatment for MRSA infections.   Culture, blood (routine x 2)     Status: Abnormal (Preliminary result)   Collection Time: 11/23/16  4:39 PM  Result Value Ref Range Status   Specimen Description BLOOD RIGHT ARM  Final   Special Requests   Final    BOTTLES DRAWN AEROBIC ONLY Blood Culture adequate volume   Culture  Setup Time   Final    GRAM POSITIVE COCCI IN CLUSTERS AEROBIC BOTTLE ONLY CRITICAL RESULT CALLED TO, READ BACK BY AND VERIFIED WITH: M TURNER,PHARMD AT 0263 11/24/16 BY L BENFIELD    Culture (A)  Final  STAPHYLOCOCCUS AUREUS SUSCEPTIBILITIES TO FOLLOW    Report Status PENDING  Incomplete  Culture, blood (routine x 2)     Status: Abnormal (Preliminary result)   Collection Time: 11/23/16  4:39 PM  Result Value Ref Range Status   Specimen Description BLOOD RIGHT HAND  Final   Special Requests   Final    BOTTLES DRAWN AEROBIC ONLY Blood Culture adequate volume   Culture  Setup Time   Final    GRAM POSITIVE COCCI IN CLUSTERS AEROBIC BOTTLE ONLY CRITICAL VALUE NOTED.  VALUE IS CONSISTENT WITH PREVIOUSLY REPORTED AND CALLED VALUE.    Culture STAPHYLOCOCCUS AUREUS (A)  Final   Report Status  PENDING  Incomplete  Blood Culture ID Panel (Reflexed)     Status: Abnormal   Collection Time: 11/23/16  4:39 PM  Result Value Ref Range Status   Enterococcus species NOT DETECTED NOT DETECTED Final   Listeria monocytogenes NOT DETECTED NOT DETECTED Final   Staphylococcus species DETECTED (A) NOT DETECTED Final    Comment: CRITICAL RESULT CALLED TO, READ BACK BY AND VERIFIED WITH: M TURNER,PHARMD AT 9509 11/24/16 BY L BENFIELD    Staphylococcus aureus DETECTED (A) NOT DETECTED Final    Comment: Methicillin (oxacillin) susceptible Staphylococcus aureus (MSSA). Preferred therapy is anti staphylococcal beta lactam antibiotic (Cefazolin or Nafcillin), unless clinically contraindicated. CRITICAL RESULT CALLED TO, READ BACK BY AND VERIFIED WITH: M TURNER,PHARMD AT 3267 11/24/16 BY L BENFIELD    Methicillin resistance NOT DETECTED NOT DETECTED Final   Streptococcus species NOT DETECTED NOT DETECTED Final   Streptococcus agalactiae NOT DETECTED NOT DETECTED Final   Streptococcus pneumoniae NOT DETECTED NOT DETECTED Final   Streptococcus pyogenes NOT DETECTED NOT DETECTED Final   Acinetobacter baumannii NOT DETECTED NOT DETECTED Final   Enterobacteriaceae species NOT DETECTED NOT DETECTED Final   Enterobacter cloacae complex NOT DETECTED NOT DETECTED Final   Escherichia coli NOT DETECTED NOT DETECTED Final   Klebsiella oxytoca NOT DETECTED NOT DETECTED Final   Klebsiella pneumoniae NOT DETECTED NOT DETECTED Final   Proteus species NOT DETECTED NOT DETECTED Final   Serratia marcescens NOT DETECTED NOT DETECTED Final   Haemophilus influenzae NOT DETECTED NOT DETECTED Final   Neisseria meningitidis NOT DETECTED NOT DETECTED Final   Pseudomonas aeruginosa NOT DETECTED NOT DETECTED Final   Candida albicans NOT DETECTED NOT DETECTED Final   Candida glabrata NOT DETECTED NOT DETECTED Final   Candida krusei NOT DETECTED NOT DETECTED Final   Candida parapsilosis NOT DETECTED NOT DETECTED Final    Candida tropicalis NOT DETECTED NOT DETECTED Final     Labs: Basic Metabolic Panel:  Recent Labs Lab 11/28/2016 1300 11/22/16 0528 11/23/16 0549 11/24/16 0417 Dec 08, 2016 0526  NA 149* 152* 151* 156* 153*  K 4.5 3.6 3.5 3.3* 4.7  CL 114* 121* 119* 119* 122*  CO2 18* 24 25 27 24   GLUCOSE 154* 106* 111* 114* 144*  BUN 35* 32* 30* 26* 30*  CREATININE 1.80* 1.23 1.67* 1.25* 1.32*  CALCIUM 9.1 7.9* 8.2* 8.5* 8.0*   Liver Function Tests:  Recent Labs Lab 11/16/2016 1300 11/22/16 0528 11/23/16 0549 11/24/16 0417 2016/12/08 0526  AST 115* 253* 91* 56* 38  ALT 83* 207* 170* 137* 94*  ALKPHOS 103 82 87 86 76  BILITOT 1.4* 0.7 0.8 1.4* 1.9*  PROT 6.2* 4.9* 5.3* 5.2* 4.8*  ALBUMIN 3.4* 2.7* 2.7* 2.5* 2.3*   No results for input(s): LIPASE, AMYLASE in the last 168 hours.  Recent Labs Lab 11/23/16 0549  AMMONIA 30  CBC:  Recent Labs Lab 11/01/2016 1300 11/01/2016 1626 11/22/16 0528 11/23/16 0549 11/24/16 0417 12/01/2016 0526  WBC 10.6*  --  8.0 8.8 9.8 11.6*  NEUTROABS 7.8*  --   --   --   --   --   HGB 13.9  --  12.0* 12.1* 11.9* 11.4*  HCT 43.9  --  38.5* 39.1 38.5* 35.8*  MCV 95.4  --  96.7 94.9 97.7 97.0  PLT 87* 71* 81* 92* 87* 91*   Cardiac Enzymes: No results for input(s): CKTOTAL, CKMB, CKMBINDEX, TROPONINI in the last 168 hours. BNP: BNP (last 3 results) No results for input(s): BNP in the last 8760 hours.  ProBNP (last 3 results) No results for input(s): PROBNP in the last 8760 hours.  CBG:  Recent Labs Lab 11/23/16 0758 11/24/16 1221  GLUCAP 118* 146*       Signed:  Chetan Mehring MD, PhD  [facility] 2016/12/01, 1:25 PM

## 2016-12-02 DEATH — deceased

## 2017-11-27 IMAGING — CT CT CERVICAL SPINE W/O CM
2 of 13 series · 7 of 33 positions shown, 8 images · non-contrast
Comparison: 06/01/2016 head and cervical spine CT

CLINICAL DATA: Unwitnessed fall. Swelling over the left cheek and
orbital region.

EXAM:
CT HEAD WITHOUT CONTRAST
CT MAXILLOFACIAL WITHOUT CONTRAST
CT CERVICAL SPINE WITHOUT CONTRAST
TECHNIQUE: Multidetector CT imaging of the head, cervical spine, and
maxillofacial structures were performed using the standard protocol
without intravenous contrast. Multiplanar CT image reconstructions
of the cervical spine and maxillofacial structures were also
generated.

[Series 6: c_spine 2.0 st · axial · 0.31mm/px · z∈[-294,-92]mm · 3 of 102 slices shown, 4 images]
[im 1/102  soft-tissue]
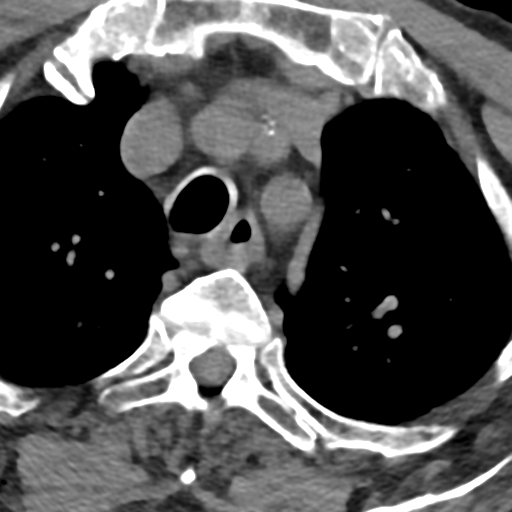
[im 1/102  bone]
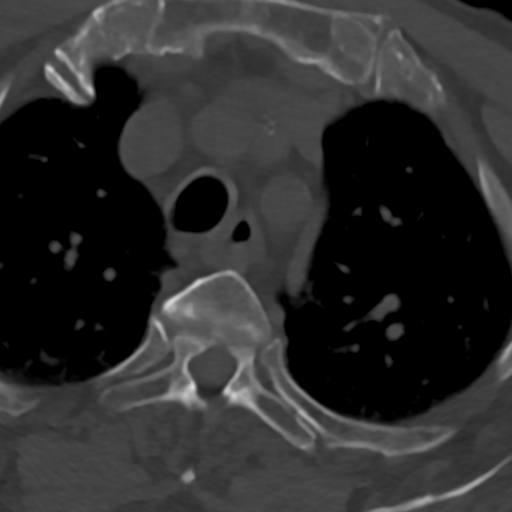
[im 51/102  bone]
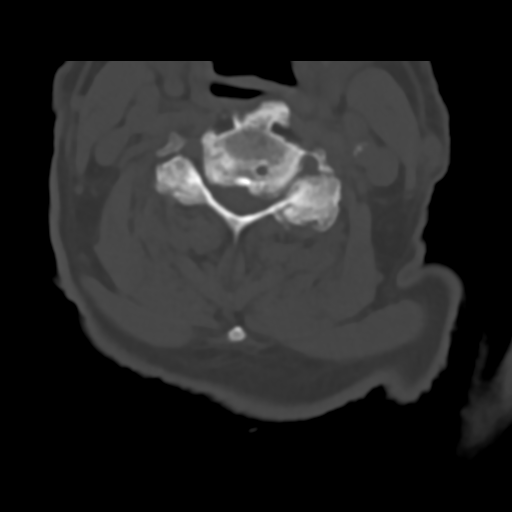
[im 102/102  bone]
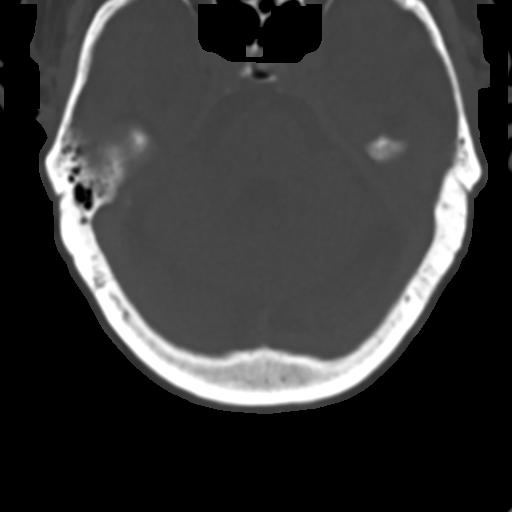

[Series 16: facialbone 2.0 sag st · sagittal · 0.33mm/px · 4 of 84 slices shown]
[im 17/84  bone]
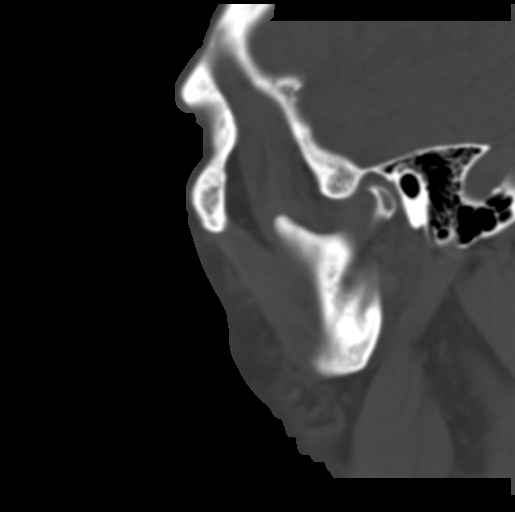
[im 34/84  bone]
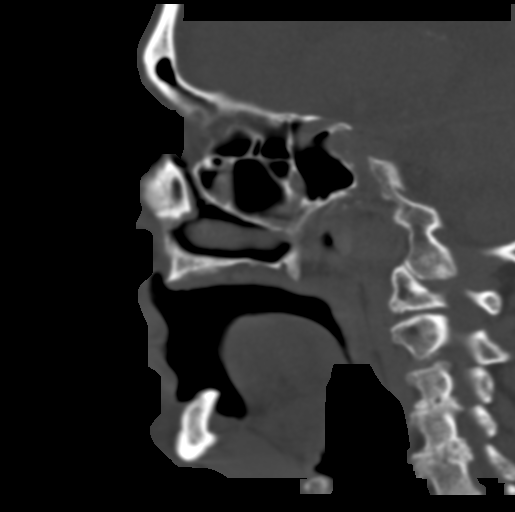
[im 50/84  bone]
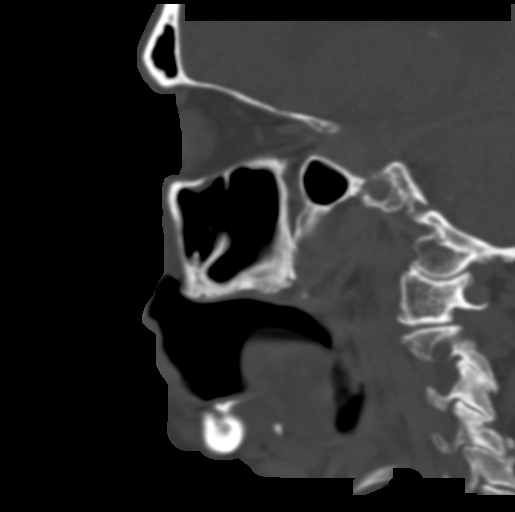
[im 67/84  bone]
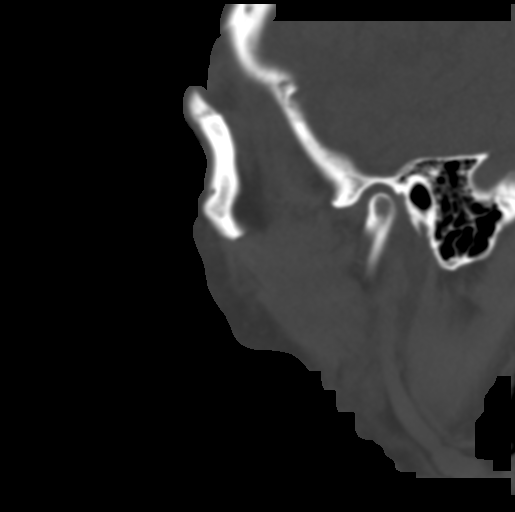

[7 of 33 positions shown; findings below may reference images not displayed]

FINDINGS: CT HEAD FINDINGS

Brain: There is no evidence for acute hemorrhage, hydrocephalus,
mass lesion, or abnormal extra-axial fluid collection. No definite
CT evidence for acute infarction. Diffuse loss of parenchymal volume
is consistent with atrophy. Patchy low attenuation in the deep
hemispheric and periventricular white matter is nonspecific, but
likely reflects chronic microvascular ischemic demyelination.

Vascular: No hyperdense vessel or unexpected calcification.

Skull: No evidence for fracture. No worrisome lytic or sclerotic
lesion.

Other: The visualized paranasal sinuses and mastoid air cells are
clear. Visualized portions of the globes and intraorbital fat are
unremarkable.

CT MAXILLOFACIAL FINDINGS

Osseous: No fracture or mandibular dislocation. No destructive
process.

Orbits: Negative. No traumatic or inflammatory finding.

Sinuses: Clear.

Soft tissues: Negative.

CT CERVICAL SPINE FINDINGS

Alignment: Mild straightening normal cervical lordosis.

Skull base and vertebrae: No acute fracture. No primary bone lesion
or focal pathologic process.

Soft tissues and spinal canal: No prevertebral fluid or swelling. No
visible canal hematoma.

Disc levels: Loss of disc height with endplate degeneration is seen
at C3-4, C4-5, and C6-7. Diffuse endplate spurring is seen at
essentially all levels of the cervical spine. Relatively diffuse
mild facet osteoarthritis noted bilaterally.

Upper chest: Negative.

Other: None.
IMPRESSION: 1. No acute intracranial abnormality. Atrophy with chronic small
vessel white matter ischemic disease.
2. Degenerative changes without acute bony abnormality in the
cervical spine.
3. No evidence for acute fracture involving the bony anatomy of the
face.
